# Patient Record
Sex: Female | Born: 1990
Health system: Southern US, Community
[De-identification: ages and names within clinical notes are randomized; demographics above are authoritative.]

## PROBLEM LIST (undated history)

## (undated) ENCOUNTER — Ambulatory Visit: Admission: EM | Payer: 59 | Source: Home / Self Care

## (undated) DIAGNOSIS — D649 Anemia, unspecified: Secondary | ICD-10-CM

## (undated) DIAGNOSIS — F411 Generalized anxiety disorder: Secondary | ICD-10-CM

## (undated) DIAGNOSIS — T7840XA Allergy, unspecified, initial encounter: Secondary | ICD-10-CM

## (undated) DIAGNOSIS — N809 Endometriosis, unspecified: Secondary | ICD-10-CM

## (undated) DIAGNOSIS — D509 Iron deficiency anemia, unspecified: Secondary | ICD-10-CM

## (undated) HISTORY — PX: OTHER SURGICAL HISTORY: SHX169

## (undated) HISTORY — DX: Allergy, unspecified, initial encounter: T78.40XA

## (undated) HISTORY — DX: Anemia, unspecified: D64.9

## (undated) HISTORY — DX: Iron deficiency anemia, unspecified: D50.9

---
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
## 2012-02-06 ENCOUNTER — Encounter (HOSPITAL_COMMUNITY): Payer: BC Managed Care – PPO | Attending: Oncology

## 2012-02-06 DIAGNOSIS — D509 Iron deficiency anemia, unspecified: Secondary | ICD-10-CM | POA: Insufficient documentation

## 2012-02-06 LAB — CBC
HCT: 34.5 % — ABNORMAL LOW (ref 36.0–46.0)
Hemoglobin: 10.5 g/dL — ABNORMAL LOW (ref 12.0–15.0)
MCH: 21.7 pg — ABNORMAL LOW (ref 26.0–34.0)
MCHC: 30.4 g/dL (ref 30.0–36.0)
MCV: 71.3 fL — ABNORMAL LOW (ref 78.0–100.0)
RDW: 21.5 % — ABNORMAL HIGH (ref 11.5–15.5)

## 2012-02-06 LAB — DIFFERENTIAL
Basophils Absolute: 0.1 10*3/uL (ref 0.0–0.1)
Basophils Relative: 1 % (ref 0–1)
Eosinophils Relative: 2 % (ref 0–5)
Monocytes Absolute: 0.4 10*3/uL (ref 0.1–1.0)
Monocytes Relative: 5 % (ref 3–12)

## 2012-02-06 LAB — FERRITIN: Ferritin: 12 ng/mL (ref 10–291)

## 2012-02-06 LAB — IRON AND TIBC
Iron: 53 ug/dL (ref 42–135)
Saturation Ratios: 11 % — ABNORMAL LOW (ref 20–55)
TIBC: 461 ug/dL (ref 250–470)
UIBC: 408 ug/dL — ABNORMAL HIGH (ref 125–400)

## 2012-02-08 ENCOUNTER — Ambulatory Visit (HOSPITAL_COMMUNITY): Payer: Self-pay | Admitting: Oncology

## 2012-02-10 ENCOUNTER — Encounter (HOSPITAL_BASED_OUTPATIENT_CLINIC_OR_DEPARTMENT_OTHER): Payer: BC Managed Care – PPO | Admitting: Oncology

## 2012-02-10 VITALS — BP 103/66 | HR 73 | Temp 98.3°F | Wt 108.8 lb

## 2012-02-10 DIAGNOSIS — D649 Anemia, unspecified: Secondary | ICD-10-CM

## 2012-02-10 DIAGNOSIS — D509 Iron deficiency anemia, unspecified: Secondary | ICD-10-CM

## 2012-02-10 NOTE — Patient Instructions (Signed)
[place] J Lafave  [id number] 06/14/1991   [facility] Specialty Clinic  Discharge Instructions  RECOMMENDATIONS MADE BY THE CONSULTANT AND ANY TEST RESULTS WILL BE SENT TO YOUR REFERRING DOCTOR.   EXAM FINDINGS BY MD TODAY AND SIGNS AND SYMPTOMS TO REPORT TO CLINIC OR PRIMARY MD: need to make sure you are taking 2 iron pills daily.  MEDICATIONS PRESCRIBED: none   INSTRUCTIONS GIVEN AND DISCUSSED: Other :  Report increased ice intake, increased fatigue, etc.  SPECIAL INSTRUCTIONS/FOLLOW-UP: Lab work Needed in 3 months and Return to Clinic in 3 months to see PA.   I acknowledge that I have been informed and understand all the instructions given to me and received a copy. I do not have any more questions at this time, but understand that I may call the Specialty Clinic at [facility] at [phone] during business hours should I have any further questions or need assistance in obtaining follow-up care.    __________________________________________  _____________  __________ Signature of Patient or Authorized Representative            Date                   Time    __________________________________________ Nurse's Signature

## 2012-02-10 NOTE — Progress Notes (Signed)
Geraldo Pitter, MD, MD [address] [place] [address]  1. Iron deficiency anemia  ferrous fumarate (HEMOCYTE - 106 MG FE) 325 (106 FE) MG TABS, CBC, Differential, Iron and TIBC, Ferritin    CURRENT THERAPY: Ferrous Sulfate 325 mg PO  INTERVAL HISTORY: [place] J Wages 21 y.o. female returns for  regular  visit for followup of iron deficiency anemia.  The patient is taking ferrous sulfate 325 mg by mouth. She was told to start taking 2 pills daily. She admits she has not been following these instructions. As that she's been taking 1 pill a day. She admits that she is tolerating the pills well. She denies any nausea, vomiting, or constipation. She explains that "I forget to take the medication twice a day."  I personally reviewed and went over laboratory results with the patient.  Her ferritin has increased nicely from 2-12. Her hemoglobin has also increased from 9.3 g/dL to 40.9 g/dL.  Her TIBC remains elevated.  Her fecal stool cards were negative for blood. She denies any menorrhagia.   As usual, the patient is not very talkative. We again had another educational session regarding iron deficiency anemia. She clearly is absorbing iron well by mouth which is evident by her ferritin.   ROS: No TIA's or unusual headaches, no dysphagia.  No prolonged cough. No dyspnea or chest pain on exertion.  No abdominal pain, change in bowel habits, black or bloody stools.  No urinary tract symptoms.  No new or unusual musculoskeletal symptoms.  Normal menses, no abnormal vaginal bleeding, discharge or unexpected pelvic pain. No new breast lumps, breast pain or nipple discharge.  The patient reports that her college education is going well. She is wished her major from nursing to respiratory therapy. 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 I've encouraged the patient to investigate whether she can job shadow one of our respiratory  therapist here at [facility]. She reports she will research that idea.   Past Medical History  Diagnosis Date  . Anemia   . Iron deficiency anemia 12/19/2011    has Iron deficiency anemia on her problem list.     is allergic to penicillins.  Ms. Mohrmann does not currently have medications on file.  No past surgical history on file.  Denies any headaches, dizziness, double vision, fevers, chills, night sweats, nausea, vomiting, diarrhea, constipation, chest pain, heart palpitations, shortness of breath, blood in stool, black tarry stool, urinary pain, urinary burning, urinary frequency, hematuria.   PHYSICAL EXAMINATION  ECOG PERFORMANCE STATUS: 0 - Asymptomatic  Filed Vitals:   02/10/12 1436  BP: 103/66  Pulse: 73  Temp: 98.3 F (36.8 C)    GENERAL:alert, healthy, no distress, well nourished, well developed, comfortable, cooperative and smiling SKIN: skin color, texture, turgor are normal, no rashes or significant lesions HEAD: Normocephalic, No masses, lesions, tenderness or abnormalities EYES: normal, EOMI, Conjunctiva are pink and non-injected EARS: External ears normal OROPHARYNX:lips, buccal mucosa, and tongue normal and mucous membranes are moist  NECK: supple, no adenopathy, thyroid normal size, non-tender, without nodularity, no stridor, non-tender, trachea midline LYMPH:  no palpable lymphadenopathy BREAST:not examined LUNGS: clear to auscultation and percussion HEART: regular rate & rhythm, no murmurs, no gallops, S1 normal and S2 normal ABDOMEN:abdomen soft, non-tender and normal bowel sounds BACK: Back symmetric, no curvature. EXTREMITIES:less then 2 second capillary refill, no joint deformities, effusion, or inflammation, no edema, no  skin discoloration, no clubbing, no cyanosis  NEURO: alert & oriented x 3 with fluent speech, no focal motor/sensory deficits, gait normal   LABORATORY DATA: Results for Wormley, [place] J (MRN [id number]) as of 02/10/2012 15:11   Ref. Range 12/19/2011 17:00 12/26/2011 14:21 12/26/2011 14:22 02/06/2012 14:14  WBC Latest Range: 4.0-10.5 K/uL 6.2   8.0  RBC Latest Range: 3.87-5.11 MIL/uL 4.47   4.84  HGB Latest Range: 12.0-15.0 g/dL 9.3 (L)   56.2 (L)  HCT Latest Range: 36.0-46.0 % 30.7 (L)   34.5 (L)  MCV Latest Range: 78.0-100.0 fL 68.7 (L)   71.3 (L)  MCH Latest Range: 26.0-34.0 pg 20.8 (L)   21.7 (L)  MCHC Latest Range: 30.0-36.0 g/dL 13.0   86.5  RDW Latest Range: 11.5-15.5 % 16.1 (H)   21.5 (H)  Platelets Latest Range: 150-400 K/uL 318   PLATELET CLUMPS NOTED ON SMEAR, COUNT APPEARS ADEQUATE    Results for Yuan, [place] J (MRN [id number]) as of 02/10/2012 15:11  Ref. Range 12/19/2011 17:00 12/26/2011 14:21 12/26/2011 14:22 02/06/2012 14:14  Iron Latest Range: 42-135 ug/dL 14 (L)   53  UIBC Latest Range: 125-400 ug/dL 284 (H)   132 (H)  TIBC Latest Range: 250-470 ug/dL 440 (H)   102  Saturation Ratios Latest Range: 20-55 % 2 (L)   11 (L)  Ferritin Latest Range: 10-291 ng/mL 4 (L)   12    Results for Swickard, [place] J (MRN [id number]) as of 02/10/2012 15:11  Ref. Range 12/26/2011 14:21 12/26/2011 14:22  Fecal Occult Blood, POC No range found NEGATIVE NEGATIVE       ASSESSMENT:  1. Anemia, probable iron deficiency anemia   PLAN:  1. I encourage the patient to increase her ferrous sulfate to twice a day dosing. 2. Laboratory work in 3 months time: CBC, ferritin, iron/TIBC 3. I personally reviewed and went over laboratory results with the patient. 4. Patient education regarding iron deficiency anemia. 5. Will continue with twice a day dosing of ferrous sulfate until hemoglobin is within normal limits. Then we will decrease dose to ferrous sulfate 325 mg, by mouth daily. 6. Return in 3 months time for followup.   All questions were answered. The patient knows to call the clinic with any problems, questions or concerns. We can certainly see the patient much sooner if necessary.  The patient and plan discussed with Glenford Peers, MD and he is in agreement with the aforementioned.  Jeffry Vogelsang

## 2012-02-27 NOTE — Progress Notes (Signed)
Labs drawn

## 2012-05-11 ENCOUNTER — Encounter (HOSPITAL_COMMUNITY): Payer: 59 | Attending: Oncology

## 2012-05-11 DIAGNOSIS — D509 Iron deficiency anemia, unspecified: Secondary | ICD-10-CM

## 2012-05-11 LAB — CBC
MCHC: 30.9 g/dL (ref 30.0–36.0)
RDW: 15.1 % (ref 11.5–15.5)

## 2012-05-11 LAB — DIFFERENTIAL
Basophils Absolute: 0 10*3/uL (ref 0.0–0.1)
Basophils Relative: 1 % (ref 0–1)
Monocytes Absolute: 0.5 10*3/uL (ref 0.1–1.0)
Neutro Abs: 3.3 10*3/uL (ref 1.7–7.7)
Neutrophils Relative %: 54 % (ref 43–77)

## 2012-05-11 LAB — IRON AND TIBC
Saturation Ratios: 44 % (ref 20–55)
TIBC: 403 ug/dL (ref 250–470)

## 2012-05-11 NOTE — Progress Notes (Signed)
Lab draw

## 2012-05-16 ENCOUNTER — Encounter (HOSPITAL_COMMUNITY): Payer: Self-pay | Admitting: Oncology

## 2012-05-16 ENCOUNTER — Encounter (HOSPITAL_COMMUNITY): Payer: 59 | Admitting: Oncology

## 2015-05-15 ENCOUNTER — Ambulatory Visit (INDEPENDENT_AMBULATORY_CARE_PROVIDER_SITE_OTHER): Payer: 59 | Admitting: Family Medicine

## 2015-05-15 VITALS — BP 118/68 | HR 76 | Temp 98.6°F | Resp 18 | Ht 64.75 in | Wt 110.6 lb

## 2015-05-15 DIAGNOSIS — Z111 Encounter for screening for respiratory tuberculosis: Secondary | ICD-10-CM

## 2015-05-15 DIAGNOSIS — Z029 Encounter for administrative examinations, unspecified: Secondary | ICD-10-CM | POA: Diagnosis not present

## 2015-05-15 DIAGNOSIS — D509 Iron deficiency anemia, unspecified: Secondary | ICD-10-CM | POA: Diagnosis not present

## 2015-05-15 LAB — POCT URINALYSIS DIPSTICK
Bilirubin, UA: NEGATIVE
Blood, UA: NEGATIVE
GLUCOSE UA: NEGATIVE
Ketones, UA: NEGATIVE
Leukocytes, UA: NEGATIVE
NITRITE UA: NEGATIVE
Protein, UA: 30
SPEC GRAV UA: 1.025
UROBILINOGEN UA: 0.2
pH, UA: 6.5
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
## 2015-05-15 NOTE — Progress Notes (Signed)

## 2015-05-16 LAB — CBC WITH DIFFERENTIAL/PLATELET
BASOS ABS: 0.1 10*3/uL (ref 0.0–0.1)
BASOS PCT: 1 % (ref 0–1)
Eosinophils Absolute: 0.2 10*3/uL (ref 0.0–0.7)
Eosinophils Relative: 3 % (ref 0–5)
HEMATOCRIT: 32.4 % — AB (ref 36.0–46.0)
Hemoglobin: 10.3 g/dL — ABNORMAL LOW (ref 12.0–15.0)
Lymphocytes Relative: 23 % (ref 12–46)
Lymphs Abs: 1.7 10*3/uL (ref 0.7–4.0)
MCH: 24.6 pg — ABNORMAL LOW (ref 26.0–34.0)
MCHC: 31.8 g/dL (ref 30.0–36.0)
MCV: 77.5 fL — AB (ref 78.0–100.0)
MONO ABS: 0.8 10*3/uL (ref 0.1–1.0)
MPV: 11.3 fL (ref 8.6–12.4)
Monocytes Relative: 10 % (ref 3–12)
Neutro Abs: 4.7 10*3/uL (ref 1.7–7.7)
Neutrophils Relative %: 63 % (ref 43–77)
PLATELETS: 301 10*3/uL (ref 150–400)
RBC: 4.18 MIL/uL (ref 3.87–5.11)
RDW: 13.9 % (ref 11.5–15.5)
WBC: 7.5 10*3/uL (ref 4.0–10.5)

## 2015-05-18 ENCOUNTER — Ambulatory Visit (INDEPENDENT_AMBULATORY_CARE_PROVIDER_SITE_OTHER): Payer: 59

## 2015-05-18 DIAGNOSIS — Z111 Encounter for screening for respiratory tuberculosis: Secondary | ICD-10-CM

## 2015-05-18 LAB — TB SKIN TEST
INDURATION: 0 mm
TB Skin Test: NEGATIVE

## 2015-07-17 ENCOUNTER — Encounter: Payer: Self-pay | Admitting: Family Medicine

## 2015-07-17 ENCOUNTER — Ambulatory Visit (INDEPENDENT_AMBULATORY_CARE_PROVIDER_SITE_OTHER): Payer: 59 | Admitting: Family Medicine

## 2015-07-17 VITALS — BP 100/60 | HR 70 | Temp 98.4°F | Resp 16 | Ht 64.5 in | Wt 105.0 lb

## 2015-07-17 DIAGNOSIS — J029 Acute pharyngitis, unspecified: Secondary | ICD-10-CM | POA: Diagnosis not present

## 2015-07-17 LAB — POCT RAPID STREP A (OFFICE): Rapid Strep A Screen: NEGATIVE

## 2015-07-17 NOTE — Patient Instructions (Signed)
Drink plenty of fluids and get enough rest  Take Tylenol ibuprofen for pain  Use lozenges as needed  Pharyngitis Pharyngitis is redness, pain, and swelling (inflammation) of your pharynx.  CAUSES  Pharyngitis is usually caused by infection. Most of the time, these infections are from viruses (viral) and are part of a cold. However, sometimes pharyngitis is caused by bacteria (bacterial). Pharyngitis can also be caused by allergies. Viral pharyngitis may be spread from person to person by coughing, sneezing, and personal items or utensils (cups, forks, spoons, toothbrushes). Bacterial pharyngitis may be spread from person to person by more intimate contact, such as kissing.  SIGNS AND SYMPTOMS  Symptoms of pharyngitis include:   Sore throat.   Tiredness (fatigue).   Low-grade fever.   Headache.  Joint pain and muscle aches.  Skin rashes.  Swollen lymph nodes.  Plaque-like film on throat or tonsils (often seen with bacterial pharyngitis). DIAGNOSIS  Your health care provider will ask you questions about your illness and your symptoms. Your medical history, along with a physical exam, is often all that is needed to diagnose pharyngitis. Sometimes, a rapid strep test is done. Other lab tests may also be done, depending on the suspected cause.  TREATMENT  Viral pharyngitis will usually get better in 3-4 days without the use of medicine. Bacterial pharyngitis is treated with medicines that kill germs (antibiotics).  HOME CARE INSTRUCTIONS   Drink enough water and fluids to keep your urine clear or pale yellow.   Only take over-the-counter or prescription medicines as directed by your health care provider:   If you are prescribed antibiotics, make sure you finish them even if you start to feel better.   Do not take aspirin.   Get lots of rest.   Gargle with 8 oz of salt water ( tsp of salt per 1 qt of water) as often as every 1-2 hours to soothe your throat.   Throat  lozenges (if you are not at risk for choking) or sprays may be used to soothe your throat. SEEK MEDICAL CARE IF:   You have large, tender lumps in your neck.  You have a rash.  You cough up green, yellow-brown, or bloody spit. SEEK IMMEDIATE MEDICAL CARE IF:   Your neck becomes stiff.  You drool or are unable to swallow liquids.  You vomit or are unable to keep medicines or liquids down.  You have severe pain that does not go away with the use of recommended medicines.  You have trouble breathing (not caused by a stuffy nose). MAKE SURE YOU:   Understand these instructions.  Will watch your condition.  Will get help right away if you are not doing well or get worse. Document Released: 10/24/2005 Document Revised: 08/14/2013 Document Reviewed: 07/01/2013 [facility] Patient Information 2015 [place], [place]. This information is not intended to replace advice given to you by your health care provider. Make sure you discuss any questions you have with your health care provider.

## 2015-07-17 NOTE — Progress Notes (Signed)
Subjective:  Patient ID: Niger J Goldammer, female    DOB: 09/29/1991  Age: 24 y.o. MRN: [id number]  Sore throat since yesterday. No fever. No other major symptoms. Does smoke about 3 cigarettes a day.   Objective:   No acute distress. TMs normal. Neck supple with small nodes. Chest clear. Heart regular.  Results for orders placed or performed in visit on 07/17/15  POCT rapid strep A  Result Value Ref Range   Rapid Strep A Screen Negative Negative     Assessment & Plan:   Assessment:  Viral pharyngitis Results for orders placed or performed in visit on 07/17/15  POCT rapid strep A  Result Value Ref Range   Rapid Strep A Screen Negative Negative     Plan:  Reassurance Results for orders placed or performed in visit on 07/17/15  POCT rapid strep A  Result Value Ref Range   Rapid Strep A Screen Negative Negative     Patient Instructions  Drink plenty of fluids and get enough rest  Take Tylenol ibuprofen for pain  Use lozenges as needed  Pharyngitis Pharyngitis is redness, pain, and swelling (inflammation) of your pharynx.  CAUSES  Pharyngitis is usually caused by infection. Most of the time, these infections are from viruses (viral) and are part of a cold. However, sometimes pharyngitis is caused by bacteria (bacterial). Pharyngitis can also be caused by allergies. Viral pharyngitis may be spread from person to person by coughing, sneezing, and personal items or utensils (cups, forks, spoons, toothbrushes). Bacterial pharyngitis may be spread from person to person by more intimate contact, such as kissing.  SIGNS AND SYMPTOMS  Symptoms of pharyngitis include:   Sore throat.   Tiredness (fatigue).   Low-grade fever.   Headache.  Joint pain and muscle aches.  Skin rashes.  Swollen lymph nodes.  Plaque-like film on throat or tonsils (often seen with bacterial pharyngitis). DIAGNOSIS  Your health care provider will ask you questions about your illness  and your symptoms. Your medical history, along with a physical exam, is often all that is needed to diagnose pharyngitis. Sometimes, a rapid strep test is done. Other lab tests may also be done, depending on the suspected cause.  TREATMENT  Viral pharyngitis will usually get better in 3-4 days without the use of medicine. Bacterial pharyngitis is treated with medicines that kill germs (antibiotics).  HOME CARE INSTRUCTIONS   Drink enough water and fluids to keep your urine clear or pale yellow.   Only take over-the-counter or prescription medicines as directed by your health care provider:   If you are prescribed antibiotics, make sure you finish them even if you start to feel better.   Do not take aspirin.   Get lots of rest.   Gargle with 8 oz of salt water ( tsp of salt per 1 qt of water) as often as every 1-2 hours to soothe your throat.   Throat lozenges (if you are not at risk for choking) or sprays may be used to soothe your throat. SEEK MEDICAL CARE IF:   You have large, tender lumps in your neck.  You have a rash.  You cough up green, yellow-brown, or bloody spit. SEEK IMMEDIATE MEDICAL CARE IF:   Your neck becomes stiff.  You drool or are unable to swallow liquids.  You vomit or are unable to keep medicines or liquids down.  You have severe pain that does not go away with the use of recommended medicines.  You have trouble breathing (  not caused by a stuffy nose). MAKE SURE YOU:   Understand these instructions.  Will watch your condition.  Will get help right away if you are not doing well or get worse. Document Released: 10/24/2005 Document Revised: 08/14/2013 Document Reviewed: 07/01/2013 [facility] Patient Information 2015 [place], [place]. This information is not intended to replace advice given to you by your health care provider. Make sure you discuss any questions you have with your health care provider.      HOPPER,DAVID, MD 07/17/2015

## 2015-07-19 LAB — CULTURE, GROUP A STREP: ORGANISM ID, BACTERIA: NORMAL

## 2015-08-15 ENCOUNTER — Ambulatory Visit (INDEPENDENT_AMBULATORY_CARE_PROVIDER_SITE_OTHER): Payer: 59 | Admitting: Physician Assistant

## 2015-08-15 VITALS — BP 110/64 | HR 83 | Temp 98.2°F | Resp 16 | Ht 65.25 in | Wt 108.8 lb

## 2015-08-15 DIAGNOSIS — J029 Acute pharyngitis, unspecified: Secondary | ICD-10-CM | POA: Diagnosis not present

## 2015-08-15 DIAGNOSIS — J069 Acute upper respiratory infection, unspecified: Secondary | ICD-10-CM

## 2015-08-15 DIAGNOSIS — B9789 Other viral agents as the cause of diseases classified elsewhere: Secondary | ICD-10-CM

## 2015-08-15 LAB — POCT RAPID STREP A (OFFICE): Rapid Strep A Screen: NEGATIVE

## 2015-08-15 MED ORDER — BENZONATATE 100 MG PO CAPS
100.0000 mg | ORAL_CAPSULE | Freq: Three times a day (TID) | ORAL | Status: DC | PRN
Start: 2015-08-15 — End: 2015-08-24

## 2015-08-15 NOTE — Progress Notes (Signed)
[facility] [address] [place] [address] [phone]  Date:  08/15/2015   Name:  Teresa Mclaughlin   DOB:  06/29/1991   MRN:  [id number]  PCP:  Teresa Peers, MD    Chief Complaint: Cough and Fatigue   History of Present Illness:  This is a 24 y.o. female with PMH iron def anemia who is presenting with cough and fatigue x 2 days. She started with sore throat 5 days ago but this resolved. 2 days ago she started with a dry cough - today woke up and cough is productive. She also started with nasal congestion this morning. Her throat only hurts now if she coughs. She feels fatigued and occ feels hot. No fever or chills. She is sleeping ok. She denies sob, wheezing, otalgia. She has tried ibuprofen - last took 24 hours ago. She denies hx env allergies or asthma. She is not a smoker.  Review of Systems:  Review of Systems See HPI  Patient Active Problem List   Diagnosis Date Noted  . Iron deficiency anemia 12/19/2011    Prior to Admission medications   Medication Sig Start Date End Date Taking? Authorizing Provider  meloxicam (MOBIC) 7.5 MG tablet Take 7.5 mg by mouth daily.    Historical Provider, MD    Allergies  Allergen Reactions  . Penicillins     unknown    History reviewed. No pertinent past surgical history.  Social History  Substance Use Topics  . Smoking status: Former Research scientist (life sciences)  . Smokeless tobacco: Never Used  . Alcohol Use: No    Family History  Problem Relation Age of Onset  . Diabetes Mother   . Hypertension Mother   . Diabetes Paternal Grandfather     Medication list has been reviewed and updated.  Physical Examination:  Physical Exam  Constitutional: She is oriented to person, place, and time. She appears well-developed and well-nourished. No distress.  HENT:  Head: Normocephalic and atraumatic.  Right Ear: Hearing, tympanic membrane, external ear and ear canal normal.  Left Ear: Hearing, tympanic membrane, external ear  and ear canal normal.  Nose: Nose normal.  Mouth/Throat: Uvula is midline and mucous membranes are normal. Posterior oropharyngeal edema and posterior oropharyngeal erythema present. No oropharyngeal exudate.  Eyes: Conjunctivae and lids are normal. Right eye exhibits no discharge. Left eye exhibits no discharge. No scleral icterus.  Cardiovascular: Normal rate, regular rhythm, normal heart sounds and normal pulses.   No murmur heard. Pulmonary/Chest: Effort normal and breath sounds normal. No respiratory distress. She has no wheezes. She has no rhonchi. She has no rales.  Musculoskeletal: Normal range of motion.  Lymphadenopathy:       Head (right side): No submental, no submandibular and no tonsillar adenopathy present.       Head (left side): No submental, no submandibular and no tonsillar adenopathy present.    She has cervical adenopathy (right, anterior).  Neurological: She is alert and oriented to person, place, and time.  Skin: Skin is warm, dry and intact. No lesion and no rash noted.  Psychiatric: She has a normal mood and affect. Her speech is normal and behavior is normal. Thought content normal.   BP 110/64 mmHg  Pulse 83  Temp(Src) 98.2 F (36.8 C) (Oral)  Resp 16  Ht 5' 5.25" (1.657 m)  Wt 108 lb 12.8 oz (49.351 kg)  BMI 17.97 kg/m2  SpO2 96%  LMP 08/10/2015  Results for orders placed or performed in visit  on 08/15/15  POCT rapid strep A  Result Value Ref Range   Rapid Strep A Screen Negative Negative    Assessment and Plan:  1. Sore throat 2. Viral uri with cough Rapid strep negative, culture pending. Etiology likely viral, focus is on supportive care. Pt will let me know if her sx are not improving in 5 days and I would consider prescribing an abx. - POCT rapid strep A - Culture, Group A Strep - benzonatate (TESSALON) 100 MG capsule; Take 1-2 capsules (100-200 mg total) by mouth 3 (three) times daily as needed for cough.  Dispense: 40 capsule; Refill:  0   Benjaman Pott. Drenda Freeze, MHS [facility]  08/15/2015

## 2015-08-15 NOTE — Patient Instructions (Signed)
Take ibuprofen 600-800 mg three times a day to help your throat pain. Drink plenty of water (64 oz/day) and get plenty of rest. Take tessalon for cough during the day. Let me know if your symptoms are not improving in 5 days.

## 2015-08-17 LAB — CULTURE, GROUP A STREP: Organism ID, Bacteria: NORMAL

## 2015-08-24 ENCOUNTER — Ambulatory Visit: Payer: 59 | Admitting: Physician Assistant

## 2015-08-24 NOTE — Progress Notes (Signed)
Opened in error

## 2015-08-26 NOTE — Progress Notes (Signed)
  Medical screening examination/treatment/procedure(s) were performed by non-physician practitioner and as supervising physician I was immediately available for consultation/collaboration.     

## 2016-01-21 DIAGNOSIS — F4321 Adjustment disorder with depressed mood: Secondary | ICD-10-CM | POA: Diagnosis not present

## 2016-02-04 DIAGNOSIS — F4321 Adjustment disorder with depressed mood: Secondary | ICD-10-CM | POA: Diagnosis not present

## 2016-02-15 DIAGNOSIS — F4321 Adjustment disorder with depressed mood: Secondary | ICD-10-CM | POA: Diagnosis not present

## 2016-03-01 DIAGNOSIS — F4321 Adjustment disorder with depressed mood: Secondary | ICD-10-CM | POA: Diagnosis not present

## 2016-10-08 ENCOUNTER — Encounter: Payer: Self-pay | Admitting: Physician Assistant

## 2016-10-08 ENCOUNTER — Ambulatory Visit (INDEPENDENT_AMBULATORY_CARE_PROVIDER_SITE_OTHER): Payer: 59 | Admitting: Physician Assistant

## 2016-10-08 VITALS — BP 106/62 | HR 85 | Temp 98.2°F | Resp 16 | Ht 62.25 in | Wt 106.0 lb

## 2016-10-08 DIAGNOSIS — F411 Generalized anxiety disorder: Secondary | ICD-10-CM | POA: Diagnosis not present

## 2016-10-08 MED ORDER — ESCITALOPRAM OXALATE 10 MG PO TABS: 10.0000 mg | ORAL_TABLET | Freq: Every day | ORAL | 1 refills | Status: DC

## 2016-10-08 NOTE — Patient Instructions (Signed)
     IF you received an x-ray today, you will receive an invoice from [facility]. Please contact [facility] at [phone] with questions or concerns regarding your invoice.   IF you received labwork today, you will receive an invoice from [facility]. Please contact [facility] at [phone] with questions or concerns regarding your invoice.   Our billing staff will not be able to assist you with questions regarding bills from these companies.  You will be contacted with the lab results as soon as they are available. The fastest way to get your results is to activate your My Chart account. Instructions are located on the last page of this paperwork. If you have not heard from us regarding the results in 2 weeks, please contact this office.      

## 2016-10-08 NOTE — Progress Notes (Signed)
   Teresa Mclaughlin  MRN: [id number] DOB: 12-25-1990  Subjective:  Pt presents to clinic with increased anxiety - she has had problems with anxiety for a while but recently is is getting worse  She tried therapy last spring but she did not feel like it helped and she really does not like talking to people.  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  She has trouble sleeping 1-2x/week because she cannot stop thinking.  She is not having physical symptoms of anxiety.  She does not have panic attacks.  She has never been treated before.  Her father has PTSD but he does not take his medication.  She also would like to gain weight - she has no appetite and knows that when she is stressed she does not have an appetite and she has trouble eating when she is not hungry.  She has problems with depression but she does not think that bothers her as much as the anxiety and the constant worry that she has.  She worries about everything - things that affect her that she has control over, things that she does not have control over and things that do not directly affect her.  She is not always sure why she worries about certain things.  Review of Systems  Psychiatric/Behavioral: Positive for dysphoric mood and sleep disturbance. The patient is nervous/anxious.     Patient Active Problem List   Diagnosis Date Noted  . Iron deficiency anemia 12/19/2011    No current outpatient prescriptions on file prior to visit.   No current facility-administered medications on file prior to visit.     Allergies  Allergen Reactions  . Penicillins     unknown    Pt patients past, family and social history were reviewed and updated.   Objective:  BP 106/62   Pulse 85   Temp 98.2 F (36.8 C) (Oral)   Resp 16   Ht 5' 2.25" (1.581 m)   Wt 106 lb (48.1 kg)   SpO2 100%   BMI 19.23 kg/m   Physical  Exam  Constitutional: She is oriented to person, place, and time and well-developed, well-nourished, and in no distress.  HENT:  Head: Normocephalic and atraumatic.  Right Ear: Hearing and external ear normal.  Left Ear: Hearing and external ear normal.  Eyes: Conjunctivae are normal.  Neck: Normal range of motion.  Pulmonary/Chest: Effort normal.  Neurological: She is alert and oriented to person, place, and time. Gait normal.  Skin: Skin is warm and dry.  Psychiatric: Memory, affect and judgment normal. She exhibits a depressed mood.  Vitals reviewed.   Assessment and Plan :  GAD (generalized anxiety disorder) - Plan: escitalopram (LEXAPRO) 10 MG tablet pt also has components of depression - we will start medication and we discussed what to expect and when to expect it - she will recheck with me in about 4 weeks and at that time we will determine the next step - she is not interested in therapy at this time.  We may consider Paxil for weight gain but it has a higher side effect potential so we will try Lexapro and hope that with her decrease anxiety she will have an increase in appetite.    Windell Hummingbird PA-C  [facility] 10/08/2016 5:00 PM

## 2016-10-19 ENCOUNTER — Encounter: Payer: Self-pay | Admitting: Physician Assistant

## 2016-10-21 MED ORDER — HYDROXYZINE HCL 25 MG PO TABS: 12.5000 mg | ORAL_TABLET | Freq: Three times a day (TID) | ORAL | 0 refills | Status: DC | PRN

## 2016-10-21 NOTE — Telephone Encounter (Signed)
Pt did not respond well to Lexapro - will do a trial of Atarax

## 2016-11-11 ENCOUNTER — Other Ambulatory Visit: Payer: Self-pay | Admitting: Physician Assistant

## 2016-11-11 NOTE — Telephone Encounter (Signed)
Mychart message for refill of Atarax. To Judson Roch.

## 2016-11-15 ENCOUNTER — Other Ambulatory Visit: Payer: Self-pay | Admitting: *Deleted

## 2016-11-15 DIAGNOSIS — F419 Anxiety disorder, unspecified: Secondary | ICD-10-CM

## 2016-11-15 MED ORDER — HYDROXYZINE HCL 25 MG PO TABS: 12.5000 mg | ORAL_TABLET | Freq: Three times a day (TID) | ORAL | 0 refills | Status: DC | PRN

## 2016-11-17 MED ORDER — HYDROXYZINE HCL 25 MG PO TABS: 12.5000 mg | ORAL_TABLET | Freq: Three times a day (TID) | ORAL | 0 refills | Status: DC | PRN

## 2016-11-17 NOTE — Telephone Encounter (Signed)
Done

## 2016-12-12 ENCOUNTER — Other Ambulatory Visit: Payer: Self-pay | Admitting: Physician Assistant

## 2017-01-26 ENCOUNTER — Other Ambulatory Visit: Payer: Self-pay | Admitting: Physician Assistant

## 2017-02-17 ENCOUNTER — Other Ambulatory Visit: Payer: Self-pay | Admitting: Physician Assistant

## 2017-02-19 NOTE — Telephone Encounter (Signed)
01/28/17 last refill

## 2017-02-26 ENCOUNTER — Other Ambulatory Visit: Payer: Self-pay | Admitting: Physician Assistant

## 2017-03-27 ENCOUNTER — Encounter: Payer: Self-pay | Admitting: Emergency Medicine

## 2017-03-27 ENCOUNTER — Ambulatory Visit (INDEPENDENT_AMBULATORY_CARE_PROVIDER_SITE_OTHER): Payer: 59 | Admitting: Emergency Medicine

## 2017-03-27 VITALS — BP 113/81 | HR 86 | Temp 98.4°F | Resp 16 | Ht 64.5 in

## 2017-03-27 DIAGNOSIS — Z111 Encounter for screening for respiratory tuberculosis: Secondary | ICD-10-CM

## 2017-03-27 NOTE — Progress Notes (Signed)

## 2017-03-27 NOTE — Patient Instructions (Signed)
     IF you received an x-ray today, you will receive an invoice from [facility]. Please contact [facility] at [phone] with questions or concerns regarding your invoice.   IF you received labwork today, you will receive an invoice from [facility]. Please contact [facility] at [phone] with questions or concerns regarding your invoice.   Our billing staff will not be able to assist you with questions regarding bills from these companies.  You will be contacted with the lab results as soon as they are available. The fastest way to get your results is to activate your My Chart account. Instructions are located on the last page of this paperwork. If you have not heard from us regarding the results in 2 weeks, please contact this office.     

## 2017-03-29 ENCOUNTER — Other Ambulatory Visit (INDEPENDENT_AMBULATORY_CARE_PROVIDER_SITE_OTHER): Payer: 59 | Admitting: Physician Assistant

## 2017-03-29 DIAGNOSIS — Z111 Encounter for screening for respiratory tuberculosis: Secondary | ICD-10-CM

## 2017-03-29 LAB — TB SKIN TEST: TB Skin Test: NEGATIVE

## 2017-03-29 NOTE — Progress Notes (Signed)
Negative tb read (bruise only)

## 2017-04-21 ENCOUNTER — Other Ambulatory Visit: Payer: Self-pay | Admitting: Physician Assistant

## 2017-05-23 ENCOUNTER — Other Ambulatory Visit: Payer: Self-pay | Admitting: *Deleted

## 2017-05-23 DIAGNOSIS — F411 Generalized anxiety disorder: Secondary | ICD-10-CM

## 2017-05-23 MED ORDER — HYDROXYZINE HCL 25 MG PO TABS: ORAL_TABLET | ORAL | 0 refills | Status: DC

## 2017-05-27 ENCOUNTER — Ambulatory Visit: Payer: 59 | Admitting: Physician Assistant

## 2017-06-19 DIAGNOSIS — Z01419 Encounter for gynecological examination (general) (routine) without abnormal findings: Secondary | ICD-10-CM | POA: Diagnosis not present

## 2017-06-19 DIAGNOSIS — Z1159 Encounter for screening for other viral diseases: Secondary | ICD-10-CM | POA: Diagnosis not present

## 2017-06-29 ENCOUNTER — Other Ambulatory Visit: Payer: Self-pay | Admitting: Physician Assistant

## 2017-07-26 ENCOUNTER — Other Ambulatory Visit: Payer: Self-pay | Admitting: Physician Assistant

## 2017-09-01 ENCOUNTER — Other Ambulatory Visit: Payer: Self-pay | Admitting: Physician Assistant

## 2017-09-27 ENCOUNTER — Other Ambulatory Visit: Payer: Self-pay | Admitting: Physician Assistant

## 2017-09-27 NOTE — Telephone Encounter (Signed)
Please advise on refill. I see 3 of the Hydroxyzine listed.

## 2017-10-27 ENCOUNTER — Other Ambulatory Visit: Payer: Self-pay | Admitting: Physician Assistant

## 2017-12-08 ENCOUNTER — Ambulatory Visit (INDEPENDENT_AMBULATORY_CARE_PROVIDER_SITE_OTHER): Payer: 59 | Admitting: Physician Assistant

## 2017-12-08 ENCOUNTER — Encounter: Payer: Self-pay | Admitting: Physician Assistant

## 2017-12-08 ENCOUNTER — Other Ambulatory Visit: Payer: Self-pay

## 2017-12-08 DIAGNOSIS — F411 Generalized anxiety disorder: Secondary | ICD-10-CM | POA: Diagnosis not present

## 2017-12-08 MED ORDER — HYDROXYZINE HCL 25 MG PO TABS: 25.0000 mg | ORAL_TABLET | Freq: Three times a day (TID) | ORAL | 5 refills | Status: DC

## 2017-12-08 NOTE — Patient Instructions (Signed)
     IF you received an x-ray today, you will receive an invoice from [facility]. Please contact [facility] at [phone] with questions or concerns regarding your invoice.   IF you received labwork today, you will receive an invoice from [facility]. Please contact [facility] at [phone] with questions or concerns regarding your invoice.   Our billing staff will not be able to assist you with questions regarding bills from these companies.  You will be contacted with the lab results as soon as they are available. The fastest way to get your results is to activate your My Chart account. Instructions are located on the last page of this paperwork. If you have not heard from us regarding the results in 2 weeks, please contact this office.     

## 2017-12-08 NOTE — Progress Notes (Signed)
Teresa Mclaughlin  MRN: [id number] DOB: [birth year]  PCP: Lucianne Lei, MD  Chief Complaint  Patient presents with  . Medication Refill    hydroxyine     Subjective:  Pt presents to clinic for medication refills.  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   She is in a big adjustment period of her life.  She takes the atarax about 2-3 times a week - most of the time at night. She does not feel depressed other than sometimes she gets down when she is by herself but she can get around others and that goes away.  She has been interviewing for jobs and that has been stressful.   History is obtained by patient.  Review of Systems  Psychiatric/Behavioral: Positive for sleep disturbance. Negative for decreased concentration, dysphoric mood, self-injury and suicidal ideas. The patient is nervous/anxious.     Patient Active Problem List   Diagnosis Date Noted  . Anxiety state 12/08/2017  . History of iron deficiency anemia 12/19/2011    No current outpatient medications on file prior to visit.   No current facility-administered medications on file prior to visit.     Allergies  Allergen Reactions  . Penicillins     unknown    Past Medical History:  Diagnosis Date  . Allergy   . Anemia   . 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 Smoking status: Former Research scientist (life sciences)  . Smokeless tobacco: Never Used  Substance Use Topics  . Alcohol use: No    Alcohol/week: 0.0 oz  . Drug use: No   family history includes Diabetes  in her mother and paternal grandfather; Hypertension in her mother.     Objective:  BP 108/62   Pulse 80   Temp 98.6 F (37 C) (Oral)   Resp 18   Ht 5' 4.5" (1.638 m)   Wt 116 lb (52.6 kg)   SpO2 100%   BMI 19.60 kg/m  Body mass index is 19.6 kg/m.  Physical Exam  Constitutional: She is oriented to person, place, and time and well-developed, well-nourished, and in no distress.  HENT:  Head: Normocephalic and atraumatic.  Right Ear: Hearing and external ear normal.  Left Ear: Hearing and external ear normal.  Eyes: Conjunctivae are normal.  Neck: Normal range of motion.  Cardiovascular: Normal rate, regular rhythm and normal heart sounds.  No murmur heard. Pulmonary/Chest: Effort normal and breath sounds normal. She has no wheezes.  Neurological: She is alert and oriented to person, place, and time. Gait normal.  Skin: Skin is warm and dry.  Psychiatric: Mood, memory, affect and judgment normal.  Vitals reviewed.   Assessment and Plan :  Anxiety state - Plan: hydrOXYzine (ATARAX/VISTARIL) 25 MG tablet  Pt is doing really well with her anxiety.  She is in a transition state and handling it  well.  She will continue to use as needed - if she finds that her symptoms get worse she will recheck with me and we may need to start an SSRI but currently she does not need anything daily.  Windell Hummingbird PA-C  [facility] 12/08/2017 12:05 PM

## 2018-01-25 DIAGNOSIS — R350 Frequency of micturition: Secondary | ICD-10-CM | POA: Diagnosis not present

## 2018-01-25 DIAGNOSIS — R102 Pelvic and perineal pain: Secondary | ICD-10-CM | POA: Diagnosis not present

## 2018-01-25 DIAGNOSIS — N760 Acute vaginitis: Secondary | ICD-10-CM | POA: Diagnosis not present

## 2018-01-26 ENCOUNTER — Other Ambulatory Visit: Payer: Self-pay | Admitting: Counselor

## 2018-01-26 DIAGNOSIS — R102 Pelvic and perineal pain: Secondary | ICD-10-CM

## 2018-01-28 ENCOUNTER — Emergency Department: Payer: 59

## 2018-01-28 ENCOUNTER — Emergency Department
Admission: EM | Admit: 2018-01-28 | Discharge: 2018-01-28 | Disposition: A | Discharge status: Home / Self Care | Payer: 59 | Attending: Emergency Medicine | Admitting: Emergency Medicine

## 2018-01-28 ENCOUNTER — Other Ambulatory Visit: Payer: Self-pay

## 2018-01-28 ENCOUNTER — Encounter: Payer: Self-pay | Admitting: Emergency Medicine

## 2018-01-28 DIAGNOSIS — N83201 Unspecified ovarian cyst, right side: Secondary | ICD-10-CM | POA: Diagnosis not present

## 2018-01-28 DIAGNOSIS — N83202 Unspecified ovarian cyst, left side: Secondary | ICD-10-CM | POA: Insufficient documentation

## 2018-01-28 DIAGNOSIS — Z87891 Personal history of nicotine dependence: Secondary | ICD-10-CM | POA: Insufficient documentation

## 2018-01-28 DIAGNOSIS — R112 Nausea with vomiting, unspecified: Secondary | ICD-10-CM | POA: Diagnosis not present

## 2018-01-28 DIAGNOSIS — Z79899 Other long term (current) drug therapy: Secondary | ICD-10-CM | POA: Diagnosis not present

## 2018-01-28 DIAGNOSIS — R102 Pelvic and perineal pain: Secondary | ICD-10-CM | POA: Diagnosis present

## 2018-01-28 DIAGNOSIS — R1013 Epigastric pain: Secondary | ICD-10-CM | POA: Diagnosis not present

## 2018-01-28 DIAGNOSIS — R109 Unspecified abdominal pain: Secondary | ICD-10-CM

## 2018-01-28 LAB — COMPREHENSIVE METABOLIC PANEL
ALT: 10 U/L — AB (ref 14–54)
AST: 20 U/L (ref 15–41)
Albumin: 4.5 g/dL (ref 3.5–5.0)
Alkaline Phosphatase: 56 U/L (ref 38–126)
Anion gap: 7 (ref 5–15)
BILIRUBIN TOTAL: 0.5 mg/dL (ref 0.3–1.2)
BUN: 13 mg/dL (ref 6–20)
CHLORIDE: 109 mmol/L (ref 101–111)
CO2: 24 mmol/L (ref 22–32)
CREATININE: 0.7 mg/dL (ref 0.44–1.00)
Calcium: 9.2 mg/dL (ref 8.9–10.3)
Glucose, Bld: 117 mg/dL — ABNORMAL HIGH (ref 65–99)
Potassium: 3.5 mmol/L (ref 3.5–5.1)
Sodium: 140 mmol/L (ref 135–145)
TOTAL PROTEIN: 7.7 g/dL (ref 6.5–8.1)

## 2018-01-28 LAB — CBC
HCT: 35.7 % (ref 35.0–47.0)
Hemoglobin: 12.2 g/dL (ref 12.0–16.0)
MCH: 26.9 pg (ref 26.0–34.0)
MCHC: 34.2 g/dL (ref 32.0–36.0)
MCV: 78.6 fL — ABNORMAL LOW (ref 80.0–100.0)
PLATELETS: 215 10*3/uL (ref 150–440)
RBC: 4.54 MIL/uL (ref 3.80–5.20)
RDW: 14 % (ref 11.5–14.5)
WBC: 10.5 10*3/uL (ref 3.6–11.0)

## 2018-01-28 LAB — URINALYSIS, COMPLETE (UACMP) WITH MICROSCOPIC
BILIRUBIN URINE: NEGATIVE
Bacteria, UA: NONE SEEN
GLUCOSE, UA: NEGATIVE mg/dL
Hgb urine dipstick: NEGATIVE
KETONES UR: 20 mg/dL — AB
NITRITE: NEGATIVE
Protein, ur: NEGATIVE mg/dL
SPECIFIC GRAVITY, URINE: 1.024 (ref 1.005–1.030)
pH: 5 (ref 5.0–8.0)

## 2018-01-28 LAB — LIPASE, BLOOD: LIPASE: 46 U/L (ref 11–51)

## 2018-01-28 LAB — HCG, QUANTITATIVE, PREGNANCY: hCG, Beta Chain, Quant, S: <1 m[IU]/mL (ref <5)

## 2018-01-28 MED ORDER — IBUPROFEN 600 MG PO TABS: 600.0000 mg | ORAL_TABLET | Freq: Three times a day (TID) | ORAL | 0 refills | Status: DC | PRN

## 2018-01-28 MED ORDER — ONDANSETRON 4 MG PO TBDP
4.0000 mg | ORAL_TABLET | Freq: Once | ORAL | Status: AC
  Administered 2018-01-28: 4 mg via ORAL
  Filled 2018-01-28: qty 1

## 2018-01-28 MED ORDER — ONDANSETRON HCL 4 MG PO TABS: 4.0000 mg | ORAL_TABLET | Freq: Three times a day (TID) | ORAL | 0 refills | Status: DC | PRN

## 2018-01-28 MED ORDER — IBUPROFEN 600 MG PO TABS
600.0000 mg | ORAL_TABLET | Freq: Once | ORAL | Status: AC
  Administered 2018-01-28: 600 mg via ORAL
  Filled 2018-01-28: qty 1

## 2018-01-28 NOTE — ED Notes (Signed)
Patient ambulatory to lobby with steady gait, NAD noted. Verbalized understanding of discharge instructions and followup care.

## 2018-01-28 NOTE — ED Notes (Signed)
Returned from XR 

## 2018-01-28 NOTE — ED Provider Notes (Signed)
[facility] Emergency Department Provider Note ____________________________________________   I have reviewed the triage vital signs and the triage nursing note.  HISTORY  Chief Complaint Abdominal Pain   Historian Patient  HPI Teresa Mclaughlin is a 27 y.o. female presents for evaluation of generalized abdominal pain although lower greater than upper and left side more so than right side for about a week now.  States it was gradual onset, somewhat waxing and waning periods unclear whether or not it might be related to food she is decreased her p.o. intake.  She saw an urgent care and was diagnosed with chlamydia and last night took 2 pills of azithromycin and felt nauseated and threw up x1.  This morning or really overnight she also threw up a second time.  This was watery, nonbloody.  Denies constipation or diarrhea.  She also was started on Flagyl and stopped it yesterday after the vomiting and feeling dizziness and feeling like her right leg was tingling at that time which is now gone.  She reports her pain is really sort of over the bladder and left lower quadrant at this point and moderate around 6 out of 10.     Past Medical History:  Diagnosis Date  . Allergy   . Anemia   . Iron deficiency anemia 12/19/2011    Patient Active Problem List   Diagnosis Date Noted  . Anxiety state 12/08/2017  . History of iron deficiency anemia 12/19/2011    History reviewed. No pertinent surgical history.  Prior to Admission medications   Medication Sig Start Date End Date Taking? Authorizing Provider  hydrOXYzine (ATARAX/VISTARIL) 25 MG tablet Take 1 tablet (25 mg total) by mouth 3 (three) times daily. 12/08/17  Yes Weber, Sarah L, PA-C  metroNIDAZOLE (FLAGYL) 500 MG tablet Take 500 mg by mouth every 12 (twelve) hours. 01/25/18 02/01/18 Yes [provider]  ibuprofen (ADVIL,MOTRIN) 600 MG tablet Take 1 tablet (600 mg total) by mouth every 8 (eight) hours as  needed. 01/28/18   Lisa Roca, MD  ondansetron (ZOFRAN) 4 MG tablet Take 1 tablet (4 mg total) by mouth every 8 (eight) hours as needed for nausea or vomiting. 01/28/18   Lisa Roca, MD    Allergies  Allergen Reactions  . Penicillins     unknown    Family History  Problem Relation Age of Onset  . Diabetes Mother   . Hypertension Mother   . Diabetes Paternal Grandfather     Social History Social History   Tobacco Use  . Smoking status: Former Research scientist (life sciences)  . Smokeless tobacco: Never Used  Substance Use Topics  . Alcohol use: No    Alcohol/week: 0.0 oz  . Drug use: No    Review of Systems  Constitutional: Negative for fever. Eyes: Negative for visual changes. ENT: Negative for sore throat. Cardiovascular: Negative for chest pain. Respiratory: Negative for shortness of breath. Gastrointestinal: Positive as per HPI. Genitourinary: Negative for dysuria. Musculoskeletal: Negative for back pain. Skin: Negative for rash. Neurological: Negative for headache.  ____________________________________________   PHYSICAL EXAM:  VITAL SIGNS: ED Triage Vitals  Enc Vitals Group     BP 01/28/18 0313 122/66     Pulse Rate 01/28/18 0313 95     Resp 01/28/18 0313 18     Temp 01/28/18 0313 97.6 F (36.4 C)     Temp Source 01/28/18 0313 Oral     SpO2 01/28/18 0313 100 %     Weight 01/28/18 0314 113 lb (51.3 kg)  Height 01/28/18 0314 5\' 4"  (1.626 m)     Head Circumference --      Peak Flow --      Pain Score 01/28/18 0314 4     Pain Loc --      Pain Edu? --      Excl. in [place]? --      Constitutional: Alert and oriented. Well appearing and in no distress. HEENT   Head: Normocephalic and atraumatic.      Eyes: Conjunctivae are normal. Pupils equal and round.       Ears:         Nose: No congestion/rhinnorhea.   Mouth/Throat: Mucous membranes are moist.   Neck: No stridor. Cardiovascular/Chest: Normal rate, regular rhythm.  No murmurs, rubs, or  gallops. Respiratory: Normal respiratory effort without tachypnea nor retractions. Breath sounds are clear and equal bilaterally. No wheezes/rales/rhonchi. Gastrointestinal: Soft. No distention, no guarding, no rebound.  Mild tenderness mid abdomen and left lower quadrant and also suprapubic.  No focal McBurney's point tenderness.  No epigastric or right upper quadrant pain. Genitourinary/rectal:Deferred Musculoskeletal: Nontender with normal range of motion in all extremities. No joint effusions.  No lower extremity tenderness.  No edema. Neurologic:  Normal speech and language. No gross or focal neurologic deficits are appreciated. Skin:  Skin is warm, dry and intact. No rash noted. Psychiatric: Mood and affect are normal. Speech and behavior are normal. Patient exhibits appropriate insight and judgment.   ____________________________________________  LABS (pertinent positives/negatives) I, Lisa Roca, MD the attending physician have reviewed the labs noted below.  Labs Reviewed  COMPREHENSIVE METABOLIC PANEL - Abnormal; Notable for the following components:      Result Value   Glucose, Bld 117 (*)    ALT 10 (*)    All other components within normal limits  CBC - Abnormal; Notable for the following components:   MCV 78.6 (*)    All other components within normal limits  URINALYSIS, COMPLETE (UACMP) WITH MICROSCOPIC - Abnormal; Notable for the following components:   Color, Urine YELLOW (*)    APPearance HAZY (*)    Ketones, ur 20 (*)    Leukocytes, UA TRACE (*)    Squamous Epithelial / LPF 6-30 (*)    All other components within normal limits  LIPASE, BLOOD  HCG, QUANTITATIVE, PREGNANCY   ____________________________________________  RADIOLOGY   Pelvic ultrasound with transvaginal and Doppler: Interpreted by radiologist:    CLINICAL DATA: Lower abdominal pain for 3 days.  EXAM: TRANSABDOMINAL ULTRASOUND OF PELVIS  DOPPLER ULTRASOUND OF  OVARIES  TECHNIQUE: Transabdominal ultrasound examination of the pelvis was performed including evaluation of the uterus, ovaries, adnexal regions, and pelvic cul-de-sac.  Color and duplex Doppler ultrasound was utilized to evaluate blood flow to the ovaries.  COMPARISON: None.  FINDINGS: Uterus  Measurements: 7.6 x 2.7 x 4.6 cm. No fibroids or other mass visualized.  Endometrium  Thickness: 5 mm. No focal abnormality visualized.  Right ovary  Measurements: 4.8 x 4.1 x 2.6 cm. Contains a 2.6 x 2.3 x 2.9 cm cyst/follicle.  Left ovary  Measurements: 8.7 x 7.2 x 9.7 cm. Contains a large cyst measuring 8.1 x 6.8 x 8.4 cm. There is a thin rim of surrounding ovarian tissue which demonstrates blood flow.  Pulsed Doppler evaluation demonstrates normal low-resistance arterial and venous waveforms in both ovaries.  IMPRESSION: 1. There is a large cyst in the left ovary measuring 8.1 x 6.8 x 8.4 cm. Since these may be difficult to assess completely with [place],  further evaluation of simple-appearing cysts >7 cm with MRI or surgical evaluation is recommended according to the Society of Radiologists in Ultrasound 2010 Consensus Conference Statement (D Clovis Riley et al. Management of Asymptomatic Ovarian and other Adnexal Cysts Imaged at [place]: Society of Radiologists in [place] Statement 2010. Radiology 256 (Sept 2010): 943-954.). 2. There is a 2.9 cm cyst in the right ovary requiring no follow-up. 3. No other acute abnormalities.      __________________________________________  PROCEDURES  Procedure(s) performed: None  Procedures  Critical Care performed: None   ____________________________________________  ED COURSE / ASSESSMENT AND PLAN  Pertinent labs & imaging results that were available during my care of the patient were reviewed by me and considered in my medical decision making (see chart for details).   Patient just had a pelvic exam,  will hold off on repeating that.  She took medication for positive chlamydia it sounds like and was discharged with Flagyl and I am to have her go ahead and restart that.  Her exam is overall reassuring and I have low suspicion for intra-abdominal emergency such as appendicitis or obstruction or TOA, however I am going to obtain a pelvic ultrasound to look for any signs of ovarian cyst or torsion or TOA.  We discussed I am not recommending CT imaging of the abdomen at this time.  We did agreed to do x-ray just to make sure that there is no additional concerning findings.  Laboratory studies are overall reassuring with no elevated white blood cell count, hemoglobin of 12, electrolytes normal.  It sure seems like the vomiting may have been related to the antibiotic, however given the abdominal cramping and now the vomiting with some nausea, it may be that this actually is a GI viral illness as this is extremely common in the community right now.   Ultrasound shows small right-sided ovarian cyst, but very large left-sided ovarian cyst.  Possible her pain which is mostly on the left lower quadrant is coming from that left ovarian cyst.  No sign of torsion.  We discussed as radiologist report notes, likely recommend further imaging with MRI.  I am going to also refer her for follow-up this week with OB/GYN.  I also printed out and gave her report as well.   CONSULTATIONS:   None   Patient / Family / Caregiver informed of clinical course, medical decision-making process, and agree with plan.   I discussed return precautions, follow-up instructions, and discharge instructions with patient and/or family.  Discharge Instructions : You are evaluated for nausea and vomiting as well as abdominal pain and as we discussed, severe symptoms seem more so related to possible viral GI illness.  However, your ultrasound did show a small right ovarian cyst, and a large left-sided ovarian cyst.  It is  possible that your lower pelvic discomfort is from the cyst.  The one on the left side is fairly large and will likely need additional imaging, likely an MRI to be ordered either by her primary care doctor or an OB/GYN doctor, and you are referred to see in follow-up with OB/GYN.  Return to the emergency room immediately for any worsening condition including uncontrolled pain, continued vomiting concern for dehydration such as not making urine or dry mouth, dizziness or passing out, or any other symptoms concerning to you.    ___________________________________________   FINAL CLINICAL IMPRESSION(S) / ED DIAGNOSES   Final diagnoses:  Abdominal pain  Nausea and vomiting, intractability of vomiting not specified, unspecified  vomiting type  Right ovarian cyst  Ovarian cyst, left      ___________________________________________        Note: This dictation was prepared with Dragon dictation. Any transcriptional errors that result from this process are unintentional    Lisa Roca, MD 01/28/18 1455

## 2018-01-28 NOTE — Discharge Instructions (Addendum)
You are evaluated for nausea and vomiting as well as abdominal pain and as we discussed, severe symptoms seem more so related to possible viral GI illness.  However, your ultrasound did show a small right ovarian cyst, and a large left-sided ovarian cyst.  It is possible that your lower pelvic discomfort is from the cyst.  The one on the left side is fairly large and will likely need additional imaging, likely an MRI to be ordered either by her primary care doctor or an OB/GYN doctor, and you are referred to see in follow-up with OB/GYN.  Return to the emergency room immediately for any worsening condition including uncontrolled pain, continued vomiting concern for dehydration such as not making urine or dry mouth, dizziness or passing out, or any other symptoms concerning to you.

## 2018-01-28 NOTE — ED Notes (Signed)
Patient transported to X-ray 

## 2018-01-28 NOTE — ED Triage Notes (Signed)
Patient ambulatory to triage with complaints of abdominal and pelvic pain since Thursday, pt reports being seen at urgent care at that time and dx of chlamydia  and BV, pt stated taking Flagyl and Azithromycin but recently stopped the Flagyl d/t paresthesia  and dizziness.   Pt reports yellow emesis earlier today x 1. Constant dull pain in lower abdomen with nausea. Speaking in complete coherent sentences. No acute breathing distress noted.

## 2018-01-29 ENCOUNTER — Ambulatory Visit: Payer: Self-pay

## 2018-01-30 ENCOUNTER — Other Ambulatory Visit: Payer: Self-pay

## 2018-01-30 ENCOUNTER — Encounter: Payer: Self-pay | Admitting: Physician Assistant

## 2018-01-30 ENCOUNTER — Ambulatory Visit: Payer: 59 | Admitting: Physician Assistant

## 2018-01-30 ENCOUNTER — Ambulatory Visit: Payer: Self-pay

## 2018-01-30 VITALS — BP 102/60 | HR 98 | Temp 98.7°F | Resp 18 | Ht 64.0 in | Wt 111.4 lb

## 2018-01-30 DIAGNOSIS — R102 Pelvic and perineal pain: Secondary | ICD-10-CM | POA: Diagnosis not present

## 2018-01-30 DIAGNOSIS — A749 Chlamydial infection, unspecified: Secondary | ICD-10-CM

## 2018-01-30 DIAGNOSIS — N83202 Unspecified ovarian cyst, left side: Secondary | ICD-10-CM

## 2018-01-30 NOTE — Patient Instructions (Signed)
     IF you received an x-ray today, you will receive an invoice from [facility]. Please contact [facility] at [phone] with questions or concerns regarding your invoice.   IF you received labwork today, you will receive an invoice from [facility]. Please contact [facility] at [phone] with questions or concerns regarding your invoice.   Our billing staff will not be able to assist you with questions regarding bills from these companies.  You will be contacted with the lab results as soon as they are available. The fastest way to get your results is to activate your My Chart account. Instructions are located on the last page of this paperwork. If you have not heard from us regarding the results in 2 weeks, please contact this office.     

## 2018-01-30 NOTE — Progress Notes (Signed)
Niger J Stenzel  MRN: [id number] DOB: [birth year]  PCP: Mancel Bale, PA-C  Chief Complaint  Patient presents with  . Hospitalization Follow-up    Subjective:  Pt presents to clinic for f/u from ED visit on Sat night.  She worked all day and then at evening about 7pm she took Zithromax 1g (to treart chalmydia she was just diagnosed with) - about 2 hours later she started to get nauseated - then 4 hours later she started to have multuiple episodes of emesis.  She did have generalized abd pain duiring this time.  She had diarrhea yesterday but none prior to her ED visit.  At the ED they did an [place] because she had been having for the last week significant LLQ pain - and they found a 6x8x6 left ovarian cyst.  Today she has had no nausea or vomiting or diarrhea.  She is not having pelvic pain.  She is currently not on OCP. She has never had ovarian cysts.  She sees Dr Garwin Brothers for GYN but has not been able to get an appt.  Reviewed records and results from ED visit - unable to see UC visit where she was diagnosed with clalmydia due to outside this hospital system.  LMP - 3/8  History is obtained by patient .  Review of Systems  Constitutional: Negative for chills and fever.  Gastrointestinal: Positive for abdominal pain (LLQ - not currently today).  Psychiatric/Behavioral: Negative for dysphoric mood and sleep disturbance. The patient is nervous/anxious.     Patient Active Problem List   Diagnosis Date Noted  . Anxiety state 12/08/2017  . History of iron deficiency anemia 12/19/2011    Current Outpatient Medications on File Prior to Visit  Medication Sig Dispense Refill  . hydrOXYzine (ATARAX/VISTARIL) 25 MG tablet Take 1 tablet (25 mg total) by mouth 3 (three) times daily. 60 tablet 5  . ibuprofen (ADVIL,MOTRIN) 600 MG tablet Take 1 tablet (600 mg total) by mouth every 8 (eight) hours as needed. 20 tablet 0  . ondansetron (ZOFRAN) 4 MG tablet Take 1 tablet (4 mg total) by mouth every  8 (eight) hours as needed for nausea or vomiting. 10 tablet 0  . metroNIDAZOLE (FLAGYL) 500 MG tablet Take 500 mg by mouth every 12 (twelve) hours.  0   No current facility-administered medications on file prior to visit.     Allergies  Allergen Reactions  . Flagyl [Metronidazole]     Numbness and dizziness   . Penicillins     unknown    Past Medical History:  Diagnosis Date  . Allergy   . Anemia   . Iron deficiency anemia 12/19/2011   Social History   Social History Narrative   Marital status: single; not dating      Children: none      Lives: with mom, dad in [place]      Employment: [employer] - at [employer] - graduated      Tobacco: none      Alcohol; none      Drug: none      Exercise: none   Social History   Tobacco Use  . Smoking status: Former Research scientist (life sciences)  . Smokeless tobacco: Never Used  Substance Use Topics  . Alcohol use: No    Alcohol/week: 0.0 oz  . Drug use: No   family history includes Diabetes in her mother and paternal grandfather; Hypertension in  her mother.     Objective:  BP 102/60   Pulse 98   Temp 98.7 F (37.1 C) (Oral)   Resp 18   Ht 5\' 4"  (1.626 m)   Wt 111 lb 6.4 oz (50.5 kg)   LMP 01/10/2018   SpO2 100%   BMI 19.12 kg/m  Body mass index is 19.12 kg/m.  Physical Exam  Constitutional: She is oriented to person, place, and time and well-developed, well-nourished, and in no distress.  HENT:  Head: Normocephalic and atraumatic.  Right Ear: Hearing and external ear normal.  Left Ear: Hearing and external ear normal.  Eyes: Conjunctivae are normal.  Neck: Normal range of motion.  Cardiovascular: Normal rate, regular rhythm and normal heart sounds.  No murmur heard. Pulmonary/Chest: Effort normal and breath sounds normal. She has no wheezes.  Abdominal: Soft. Bowel sounds are normal. There is no tenderness.  Neurological: She is alert and oriented to person,  place, and time. Gait normal.  Skin: Skin is warm and dry.  Psychiatric: Mood, memory, affect and judgment normal.  Vitals reviewed.   Assessment and Plan :  Pelvic pain in female - Plan: Ambulatory referral to Gynecology - likely from ovarian cyst but she did just get treated for Chlamydia and thinks that she kept down the abx but due to know knowing encouraged test for cure in about a month  Left ovarian cyst - Plan: Ambulatory referral to Gynecology - due to size needs appt sooner than she could get with calling the office - did a referral in hopes that she can be seen before she starts her new job next week due to no time off for office visits.  Due to size of cyst d/w pt warning signs and when to RTC sooner if she has not yet seen GYN.  Chlamydia infection -   Windell Hummingbird PA-C  [facility] 01/30/2018 4:36 PM

## 2018-02-06 ENCOUNTER — Telehealth: Payer: Self-pay | Admitting: Obstetrics and Gynecology

## 2018-02-06 NOTE — Telephone Encounter (Signed)
Called and left a message for patient to call back to schedule a new patient doctor referral appointment with our office for pelvic pain and ovarian cyst.

## 2018-02-08 NOTE — Telephone Encounter (Signed)
Called and left a message for patient to call back to schedule a new patient doctor referral appointment with our office for pelvic pain and ovarian cyst.

## 2018-02-09 NOTE — Telephone Encounter (Signed)
Called and left a message for patient to call back to schedule a new patient doctor referral appointment with our officefor pelvic pain and ovarian cyst.

## 2018-02-13 NOTE — Telephone Encounter (Signed)
° °  Routing referral back to referring office since patient has not returned multiple calls to schedule an appointment with our office.

## 2018-02-14 ENCOUNTER — Encounter: Payer: Self-pay | Admitting: Physician Assistant

## 2018-02-20 DIAGNOSIS — N83202 Unspecified ovarian cyst, left side: Secondary | ICD-10-CM | POA: Diagnosis not present

## 2018-02-24 DIAGNOSIS — H52223 Regular astigmatism, bilateral: Secondary | ICD-10-CM | POA: Diagnosis not present

## 2018-02-24 DIAGNOSIS — H5213 Myopia, bilateral: Secondary | ICD-10-CM | POA: Diagnosis not present

## 2018-03-14 ENCOUNTER — Other Ambulatory Visit: Payer: Self-pay | Admitting: Obstetrics and Gynecology

## 2018-03-14 DIAGNOSIS — N83202 Unspecified ovarian cyst, left side: Secondary | ICD-10-CM

## 2018-03-16 ENCOUNTER — Other Ambulatory Visit: Payer: Self-pay | Admitting: Obstetrics and Gynecology

## 2018-03-20 ENCOUNTER — Other Ambulatory Visit: Payer: 59

## 2018-03-22 DIAGNOSIS — R1032 Left lower quadrant pain: Secondary | ICD-10-CM | POA: Diagnosis not present

## 2018-03-22 DIAGNOSIS — N83202 Unspecified ovarian cyst, left side: Secondary | ICD-10-CM | POA: Diagnosis not present

## 2018-03-22 NOTE — Patient Instructions (Addendum)
Niger J Villegas  03/22/2018   Your procedure is scheduled on: 03-29-18   Report to [facility] Main  Entrance    Report to Admitting at 11:00 AM    Call this number if you have problems the morning of surgery [phone]   Remember: Do not eat food or drink liquids :After Midnight.You may have a Clear Liquid Diet from Midnight until 7:00 AM. After 7:00 AM, nothing until after surgery.     CLEAR LIQUID DIET   Foods Allowed                                                                     Foods Excluded  Coffee and tea, regular and decaf                             liquids that you cannot  Plain Jell-O in any flavor                                             see through such as: Fruit ices (not with fruit pulp)                                     milk, soups, orange juice  Iced Popsicles                                    All solid food Carbonated beverages, regular and diet                                    Cranberry, grape and apple juices Sports drinks like Gatorade Lightly seasoned clear broth or consume(fat free) Sugar, honey syrup  Sample Menu Breakfast                                Lunch                                     Supper Cranberry juice                    Beef broth                            Chicken broth Jell-O                                     Grape juice  Apple juice Coffee or tea                        Jell-O                                      Popsicle                                                Coffee or tea                        Coffee or tea  _____________________________________________________________________     Take these medicines the morning of surgery with A SIP OF WATER: None                                You may not have any metal on your body including hair pins and              piercings  Do not wear jewelry, make-up, lotions, powders or perfumes, deodorant             Do not  wear nail polish.  Do not shave  48 hours prior to surgery.               Do not bring valuables to the hospital. [place].  Contacts, dentures or bridgework may not be worn into surgery.     Patients discharged the day of surgery will not be allowed to drive home.  Name and phone number of your driver: Daliya Parchment [phone]               Please read over the following fact sheets you were given: _____________________________________________________________________          [facility] - Preparing for Surgery Before surgery, you can play an important role.  Because skin is not sterile, your skin needs to be as free of germs as possible.  You can reduce the number of germs on your skin by washing with CHG (chlorahexidine gluconate) soap before surgery.  CHG is an antiseptic cleaner which kills germs and bonds with the skin to continue killing germs even after washing. Please DO NOT use if you have an allergy to CHG or antibacterial soaps.  If your skin becomes reddened/irritated stop using the CHG and inform your nurse when you arrive at Short Stay. Do not shave (including legs and underarms) for at least 48 hours prior to the first CHG shower.  You may shave your face/neck. Please follow these instructions carefully:  1.  Shower with CHG Soap the night before surgery and the  morning of Surgery.  2.  If you choose to wash your hair, wash your hair first as usual with your  normal  shampoo.  3.  After you shampoo, rinse your hair and body thoroughly to remove the  shampoo.                           4.  Use CHG as you would any other  liquid soap.  You can apply chg directly  to the skin and wash                       Gently with a scrungie or clean washcloth.  5.  Apply the CHG Soap to your body ONLY FROM THE NECK DOWN.   Do not use on face/ open                           Wound or open sores. Avoid contact with eyes, ears mouth and  genitals (private parts).                       Wash face,  Genitals (private parts) with your normal soap.             6.  Wash thoroughly, paying special attention to the area where your surgery  will be performed.  7.  Thoroughly rinse your body with warm water from the neck down.  8.  DO NOT shower/wash with your normal soap after using and rinsing off  the CHG Soap.                9.  Pat yourself dry with a clean towel.            10.  Wear clean pajamas.            11.  Place clean sheets on your bed the night of your first shower and do not  sleep with pets. Day of Surgery : Do not apply any lotions/deodorants the morning of surgery.  Please wear clean clothes to the hospital/surgery center.  FAILURE TO FOLLOW THESE INSTRUCTIONS MAY RESULT IN THE CANCELLATION OF YOUR SURGERY PATIENT SIGNATURE_________________________________  NURSE SIGNATURE__________________________________  ________________________________________________________________________

## 2018-03-23 ENCOUNTER — Other Ambulatory Visit: Payer: Self-pay

## 2018-03-23 ENCOUNTER — Encounter (HOSPITAL_COMMUNITY)
Admission: RE | Admit: 2018-03-23 | Discharge: 2018-03-23 | Disposition: A | Discharge status: Home / Self Care | Payer: 59 | Source: Ambulatory Visit | Attending: Obstetrics and Gynecology | Admitting: Obstetrics and Gynecology

## 2018-03-23 ENCOUNTER — Encounter (HOSPITAL_COMMUNITY): Payer: Self-pay

## 2018-03-23 DIAGNOSIS — Z01812 Encounter for preprocedural laboratory examination: Secondary | ICD-10-CM | POA: Insufficient documentation

## 2018-03-23 DIAGNOSIS — N83202 Unspecified ovarian cyst, left side: Secondary | ICD-10-CM | POA: Diagnosis not present

## 2018-03-23 DIAGNOSIS — Z0183 Encounter for blood typing: Secondary | ICD-10-CM | POA: Diagnosis not present

## 2018-03-23 LAB — CBC
HCT: 34.9 % — ABNORMAL LOW (ref 36.0–46.0)
Hemoglobin: 10.5 g/dL — ABNORMAL LOW (ref 12.0–15.0)
MCH: 24.5 pg — ABNORMAL LOW (ref 26.0–34.0)
MCHC: 30.1 g/dL (ref 30.0–36.0)
MCV: 81.4 fL (ref 78.0–100.0)
PLATELETS: 278 10*3/uL (ref 150–400)
RBC: 4.29 MIL/uL (ref 3.87–5.11)
RDW: 13.7 % (ref 11.5–15.5)
WBC: 6 10*3/uL (ref 4.0–10.5)

## 2018-03-23 LAB — ABO/RH: ABO/RH(D): O POS

## 2018-03-23 LAB — HCG, SERUM, QUALITATIVE: Preg, Serum: NEGATIVE

## 2018-03-29 ENCOUNTER — Emergency Department (HOSPITAL_COMMUNITY)
Admission: EM | Admit: 2018-03-29 | Discharge: 2018-03-29 | Disposition: A | Discharge status: Home / Self Care | Payer: 59 | Attending: Emergency Medicine | Admitting: Emergency Medicine

## 2018-03-29 ENCOUNTER — Ambulatory Visit (HOSPITAL_COMMUNITY): Payer: 59 | Admitting: Certified Registered Nurse Anesthetist

## 2018-03-29 ENCOUNTER — Other Ambulatory Visit: Payer: Self-pay

## 2018-03-29 ENCOUNTER — Ambulatory Visit (HOSPITAL_COMMUNITY)
Admission: RE | Admit: 2018-03-29 | Discharge: 2018-03-29 | Disposition: A | Discharge status: Home / Self Care | Payer: 59 | Source: Ambulatory Visit | Attending: Obstetrics and Gynecology | Admitting: Obstetrics and Gynecology

## 2018-03-29 ENCOUNTER — Encounter (HOSPITAL_COMMUNITY): Payer: Self-pay | Admitting: Emergency Medicine

## 2018-03-29 ENCOUNTER — Encounter (HOSPITAL_COMMUNITY)
Admission: RE | Disposition: A | Discharge status: Home / Self Care | Payer: Self-pay | Source: Ambulatory Visit | Attending: Obstetrics and Gynecology

## 2018-03-29 DIAGNOSIS — T7840XA Allergy, unspecified, initial encounter: Secondary | ICD-10-CM | POA: Diagnosis not present

## 2018-03-29 DIAGNOSIS — Z87891 Personal history of nicotine dependence: Secondary | ICD-10-CM | POA: Insufficient documentation

## 2018-03-29 DIAGNOSIS — N83201 Unspecified ovarian cyst, right side: Secondary | ICD-10-CM | POA: Diagnosis not present

## 2018-03-29 DIAGNOSIS — Z79899 Other long term (current) drug therapy: Secondary | ICD-10-CM | POA: Insufficient documentation

## 2018-03-29 DIAGNOSIS — F419 Anxiety disorder, unspecified: Secondary | ICD-10-CM | POA: Diagnosis not present

## 2018-03-29 DIAGNOSIS — D649 Anemia, unspecified: Secondary | ICD-10-CM | POA: Diagnosis not present

## 2018-03-29 DIAGNOSIS — N803 Endometriosis of pelvic peritoneum: Secondary | ICD-10-CM | POA: Diagnosis not present

## 2018-03-29 DIAGNOSIS — D271 Benign neoplasm of left ovary: Secondary | ICD-10-CM | POA: Insufficient documentation

## 2018-03-29 DIAGNOSIS — N83202 Unspecified ovarian cyst, left side: Secondary | ICD-10-CM | POA: Diagnosis not present

## 2018-03-29 HISTORY — DX: Endometriosis, unspecified: N80.9

## 2018-03-29 HISTORY — PX: ROBOTIC ASSISTED LAPAROSCOPIC OVARIAN CYSTECTOMY: SHX6081

## 2018-03-29 LAB — TYPE AND SCREEN
ABO/RH(D): O POS
Antibody Screen: NEGATIVE

## 2018-03-29 SURGERY — EXCISION, CYST, OVARY, ROBOT-ASSISTED, LAPAROSCOPIC
Anesthesia: General | Site: Abdomen | Laterality: Left

## 2018-03-29 MED ORDER — ARTIFICIAL TEARS OPHTHALMIC OINT
TOPICAL_OINTMENT | OPHTHALMIC | Status: AC
  Filled 2018-03-29: qty 3.5

## 2018-03-29 MED ORDER — SUCCINYLCHOLINE CHLORIDE 200 MG/10ML IV SOSY
PREFILLED_SYRINGE | INTRAVENOUS | Status: DC | PRN
  Administered 2018-03-29: 20 mg via INTRAVENOUS

## 2018-03-29 MED ORDER — FENTANYL CITRATE (PF) 100 MCG/2ML IJ SOLN
INTRAMUSCULAR | Status: DC | PRN
  Administered 2018-03-29: 50 ug via INTRAVENOUS
  Administered 2018-03-29: 100 ug via INTRAVENOUS
  Administered 2018-03-29 (×4): 50 ug via INTRAVENOUS

## 2018-03-29 MED ORDER — DEXAMETHASONE SODIUM PHOSPHATE 4 MG/ML IJ SOLN: INTRAMUSCULAR | Status: DC | PRN

## 2018-03-29 MED ORDER — TRAMADOL HCL 50 MG PO TABS: 50.0000 mg | ORAL_TABLET | Freq: Four times a day (QID) | ORAL | 0 refills | Status: AC | PRN

## 2018-03-29 MED ORDER — SODIUM CHLORIDE 0.9 % IR SOLN
Status: DC | PRN
Start: 2018-03-29 — End: 2018-03-29
  Administered 2018-03-29: 1000 mL

## 2018-03-29 MED ORDER — FENTANYL CITRATE (PF) 100 MCG/2ML IJ SOLN
INTRAMUSCULAR | Status: AC
  Filled 2018-03-29: qty 2

## 2018-03-29 MED ORDER — SODIUM CHLORIDE 0.9 % IJ SOLN
INTRAMUSCULAR | Status: AC
  Filled 2018-03-29: qty 10

## 2018-03-29 MED ORDER — LIDOCAINE 2% (20 MG/ML) 5 ML SYRINGE
INTRAMUSCULAR | Status: DC | PRN
  Administered 2018-03-29: 50 mg via INTRAVENOUS

## 2018-03-29 MED ORDER — KETOROLAC TROMETHAMINE 30 MG/ML IJ SOLN: 30.0000 mg | Freq: Once | INTRAMUSCULAR | Status: DC | PRN

## 2018-03-29 MED ORDER — MEPERIDINE HCL 50 MG/ML IJ SOLN: 6.2500 mg | INTRAMUSCULAR | Status: DC | PRN

## 2018-03-29 MED ORDER — ONDANSETRON HCL 4 MG PO TABS
4.0000 mg | ORAL_TABLET | Freq: Once | ORAL | Status: AC
  Administered 2018-03-29: 4 mg via ORAL
  Filled 2018-03-29: qty 1

## 2018-03-29 MED ORDER — ONDANSETRON HCL 4 MG/2ML IJ SOLN
INTRAMUSCULAR | Status: AC
  Filled 2018-03-29: qty 2

## 2018-03-29 MED ORDER — LACTATED RINGERS IV SOLN
INTRAVENOUS | Status: DC
  Administered 2018-03-29 (×2): via INTRAVENOUS

## 2018-03-29 MED ORDER — HYDROCODONE-ACETAMINOPHEN 5-325 MG PO TABS: 1.0000 | ORAL_TABLET | Freq: Four times a day (QID) | ORAL | 0 refills | Status: DC | PRN

## 2018-03-29 MED ORDER — BUPIVACAINE HCL (PF) 0.25 % IJ SOLN
INTRAMUSCULAR | Status: AC
  Filled 2018-03-29: qty 30

## 2018-03-29 MED ORDER — SUGAMMADEX SODIUM 200 MG/2ML IV SOLN
INTRAVENOUS | Status: AC
  Filled 2018-03-29: qty 2

## 2018-03-29 MED ORDER — DEXAMETHASONE SODIUM PHOSPHATE 10 MG/ML IJ SOLN
INTRAMUSCULAR | Status: DC | PRN
  Administered 2018-03-29: 5 mg via INTRAVENOUS

## 2018-03-29 MED ORDER — HYDROCODONE-ACETAMINOPHEN 5-325 MG PO TABS: 1.0000 | ORAL_TABLET | Freq: Once | ORAL | Status: DC

## 2018-03-29 MED ORDER — HYDROMORPHONE HCL 1 MG/ML IJ SOLN
0.2500 mg | INTRAMUSCULAR | Status: DC | PRN
  Administered 2018-03-29: 0.25 mg via INTRAVENOUS
  Administered 2018-03-29: 0.5 mg via INTRAVENOUS
  Administered 2018-03-29: 0.25 mg via INTRAVENOUS
  Administered 2018-03-29 (×2): 0.5 mg via INTRAVENOUS

## 2018-03-29 MED ORDER — KETOROLAC TROMETHAMINE 30 MG/ML IJ SOLN
INTRAMUSCULAR | Status: AC
  Filled 2018-03-29: qty 1

## 2018-03-29 MED ORDER — PROPOFOL 10 MG/ML IV BOLUS
INTRAVENOUS | Status: DC | PRN
  Administered 2018-03-29: 130 mg via INTRAVENOUS

## 2018-03-29 MED ORDER — LIDOCAINE 2% (20 MG/ML) 5 ML SYRINGE
INTRAMUSCULAR | Status: AC
  Filled 2018-03-29: qty 5

## 2018-03-29 MED ORDER — ONDANSETRON HCL 4 MG/2ML IJ SOLN
INTRAMUSCULAR | Status: DC | PRN
  Administered 2018-03-29: 4 mg via INTRAVENOUS

## 2018-03-29 MED ORDER — MIDAZOLAM HCL 2 MG/2ML IJ SOLN
INTRAMUSCULAR | Status: AC
  Filled 2018-03-29: qty 2

## 2018-03-29 MED ORDER — SCOPOLAMINE 1 MG/3DAYS TD PT72
MEDICATED_PATCH | TRANSDERMAL | Status: AC
  Filled 2018-03-29: qty 1

## 2018-03-29 MED ORDER — PROPOFOL 10 MG/ML IV BOLUS
INTRAVENOUS | Status: AC
  Filled 2018-03-29: qty 20

## 2018-03-29 MED ORDER — HYDROMORPHONE HCL 1 MG/ML IJ SOLN
INTRAMUSCULAR | Status: AC
  Filled 2018-03-29: qty 1

## 2018-03-29 MED ORDER — PROMETHAZINE HCL 25 MG/ML IJ SOLN: 6.2500 mg | INTRAMUSCULAR | Status: DC | PRN

## 2018-03-29 MED ORDER — NALOXONE HCL 0.4 MG/ML IJ SOLN
INTRAMUSCULAR | Status: DC | PRN
  Administered 2018-03-29 (×2): 40 ug via INTRAVENOUS

## 2018-03-29 MED ORDER — DIPHENHYDRAMINE HCL 50 MG/ML IJ SOLN
25.0000 mg | Freq: Once | INTRAMUSCULAR | Status: AC
  Administered 2018-03-29: 25 mg via INTRAMUSCULAR
  Filled 2018-03-29: qty 1

## 2018-03-29 MED ORDER — FENTANYL CITRATE (PF) 250 MCG/5ML IJ SOLN
INTRAMUSCULAR | Status: AC
  Filled 2018-03-29: qty 5

## 2018-03-29 MED ORDER — SUGAMMADEX SODIUM 200 MG/2ML IV SOLN
INTRAVENOUS | Status: DC | PRN
  Administered 2018-03-29: 110 mg via INTRAVENOUS

## 2018-03-29 MED ORDER — MIDAZOLAM HCL 5 MG/5ML IJ SOLN
INTRAMUSCULAR | Status: DC | PRN
  Administered 2018-03-29: 2 mg via INTRAVENOUS

## 2018-03-29 MED ORDER — HYDROCODONE-ACETAMINOPHEN 5-325 MG PO TABS
ORAL_TABLET | ORAL | Status: AC
  Administered 2018-03-29: 1
  Filled 2018-03-29: qty 1

## 2018-03-29 MED ORDER — ROCURONIUM BROMIDE 10 MG/ML (PF) SYRINGE
PREFILLED_SYRINGE | INTRAVENOUS | Status: DC | PRN
  Administered 2018-03-29: 40 mg via INTRAVENOUS
  Administered 2018-03-29 (×2): 10 mg via INTRAVENOUS

## 2018-03-29 MED ORDER — ROCURONIUM BROMIDE 10 MG/ML (PF) SYRINGE
PREFILLED_SYRINGE | INTRAVENOUS | Status: AC
  Filled 2018-03-29: qty 5

## 2018-03-29 MED ORDER — BUPIVACAINE HCL (PF) 0.25 % IJ SOLN
INTRAMUSCULAR | Status: DC | PRN
  Administered 2018-03-29: 6 mL

## 2018-03-29 MED ORDER — SCOPOLAMINE 1 MG/3DAYS TD PT72
1.0000 | MEDICATED_PATCH | Freq: Once | TRANSDERMAL | Status: DC
  Administered 2018-03-29: 1.5 mg via TRANSDERMAL

## 2018-03-29 MED ORDER — DEXAMETHASONE SODIUM PHOSPHATE 10 MG/ML IJ SOLN
INTRAMUSCULAR | Status: AC
  Filled 2018-03-29: qty 1

## 2018-03-29 MED ORDER — NALOXONE HCL 0.4 MG/ML IJ SOLN
INTRAMUSCULAR | Status: AC
  Filled 2018-03-29: qty 1

## 2018-03-29 SURGICAL SUPPLY — 56 items
APPLICATOR ARISTA FLEXITIP XL (MISCELLANEOUS) ×2 IMPLANT
BARRIER ADHS 3X4 INTERCEED (GAUZE/BANDAGES/DRESSINGS) IMPLANT
CANISTER SUCT 3000ML PPV (MISCELLANEOUS) ×2 IMPLANT
CATH FOLEY 3WAY  5CC 16FR (CATHETERS) ×1
CATH FOLEY 3WAY 5CC 16FR (CATHETERS) ×1 IMPLANT
COVER BACK TABLE 60X90IN (DRAPES) ×2 IMPLANT
COVER TIP SHEARS 8 DVNC (MISCELLANEOUS) ×1 IMPLANT
COVER TIP SHEARS 8MM DA VINCI (MISCELLANEOUS) ×1
DECANTER SPIKE VIAL GLASS SM (MISCELLANEOUS) ×2 IMPLANT
DEFOGGER SCOPE WARMER CLEARIFY (MISCELLANEOUS) ×2 IMPLANT
DERMABOND ADVANCED (GAUZE/BANDAGES/DRESSINGS) ×1
DERMABOND ADVANCED .7 DNX12 (GAUZE/BANDAGES/DRESSINGS) ×1 IMPLANT
DRAPE ARM DVNC X/XI (DISPOSABLE) ×4 IMPLANT
DRAPE COLUMN DVNC XI (DISPOSABLE) ×1 IMPLANT
DRAPE DA VINCI XI ARM (DISPOSABLE) ×4
DRAPE DA VINCI XI COLUMN (DISPOSABLE) ×1
DURAPREP 26ML APPLICATOR (WOUND CARE) ×2 IMPLANT
ELECT REM PT RETURN 15FT ADLT (MISCELLANEOUS) ×2 IMPLANT
GLOVE BIOGEL PI IND STRL 7.0 (GLOVE) ×5 IMPLANT
GLOVE BIOGEL PI INDICATOR 7.0 (GLOVE) ×5
GLOVE ECLIPSE 6.5 STRL STRAW (GLOVE) ×6 IMPLANT
HEMOSTAT ARISTA ABSORB 3G PWDR (MISCELLANEOUS) ×2 IMPLANT
IRRIG SUCT STRYKERFLOW 2 WTIP (MISCELLANEOUS) ×2
IRRIGATION SUCT STRKRFLW 2 WTP (MISCELLANEOUS) ×1 IMPLANT
NEEDLE INSUFFLATION 120MM (ENDOMECHANICALS) ×2 IMPLANT
NEEDLE INSUFFLATION 150MM (ENDOMECHANICALS) IMPLANT
OBTURATOR OPTICAL STANDARD 8MM (TROCAR)
OBTURATOR OPTICAL STND 8 DVNC (TROCAR)
OBTURATOR OPTICALSTD 8 DVNC (TROCAR) IMPLANT
OCCLUDER COLPOPNEUMO (BALLOONS) IMPLANT
PACK ROBOT WH (CUSTOM PROCEDURE TRAY) ×2 IMPLANT
PACK ROBOTIC GOWN (GOWN DISPOSABLE) ×2 IMPLANT
PACK TRENDGUARD 450 HYBRID PRO (MISCELLANEOUS) ×1 IMPLANT
PAD PREP 24X48 CUFFED NSTRL (MISCELLANEOUS) ×2 IMPLANT
POSITIONER SURGICAL ARM (MISCELLANEOUS) ×4 IMPLANT
SEAL CANN UNIV 5-8 DVNC XI (MISCELLANEOUS) ×3 IMPLANT
SEAL XI 5MM-8MM UNIVERSAL (MISCELLANEOUS) ×3
SEALER VESSEL DA VINCI XI (MISCELLANEOUS)
SEALER VESSEL EXT DVNC XI (MISCELLANEOUS) IMPLANT
SET CYSTO W/LG BORE CLAMP LF (SET/KITS/TRAYS/PACK) IMPLANT
SET TRI-LUMEN FLTR TB AIRSEAL (TUBING) ×2 IMPLANT
SUT VIC AB 0 CT1 36 (SUTURE) IMPLANT
SUT VICRYL 0 UR6 27IN ABS (SUTURE) ×2 IMPLANT
SUT VICRYL 4-0 PS2 18IN ABS (SUTURE) ×4 IMPLANT
SUT VLOC 180 0 9IN  GS21 (SUTURE)
SUT VLOC 180 0 9IN GS21 (SUTURE) IMPLANT
TIP RUMI ORANGE 6.7MMX12CM (TIP) IMPLANT
TIP UTERINE 5.1X6CM LAV DISP (MISCELLANEOUS) IMPLANT
TIP UTERINE 6.7X10CM GRN DISP (MISCELLANEOUS) IMPLANT
TIP UTERINE 6.7X6CM WHT DISP (MISCELLANEOUS) IMPLANT
TIP UTERINE 6.7X8CM BLUE DISP (MISCELLANEOUS) IMPLANT
TOWEL OR 17X26 10 PK STRL BLUE (TOWEL DISPOSABLE) ×4 IMPLANT
TRENDGUARD 450 HYBRID PRO PACK (MISCELLANEOUS) ×2
TROCAR PORT AIRSEAL 5X120 (TROCAR) ×2 IMPLANT
TROCAR PORT AIRSEAL 8X120 (TROCAR) ×2 IMPLANT
WATER STERILE IRR 1000ML POUR (IV SOLUTION) ×2 IMPLANT

## 2018-03-29 NOTE — Transfer of Care (Signed)
Immediate Anesthesia Transfer of Care Note  Patient: Teresa Mclaughlin  Procedure(s) Performed: XI ROBOTIC ASSISTED LAPAROSCOPIC OVARIAN CYSTECTOMY, EXCISION OF PELVIC ENDOMETRIOSIS (Left Abdomen)  Patient Location: PACU  Anesthesia Type:General  Level of Consciousness: drowsy and responds to stimulation  Airway & Oxygen Therapy: Patient Spontanous Breathing and Patient connected to face mask oxygen  Post-op Assessment: Report given to RN  Post vital signs: Reviewed and stable  Last Vitals:  Vitals Value Taken Time  BP 96/70 03/29/2018  4:08 PM  Temp    Pulse 114 03/29/2018  4:13 PM  Resp 17 03/29/2018  4:13 PM  SpO2 100 % 03/29/2018  4:13 PM  Vitals shown include unvalidated device data.  Last Pain:  Vitals:   03/29/18 1606  TempSrc:   PainSc: (P) 7       Patients Stated Pain Goal: (P) 4 (40/35/24 8185)  Complications: No apparent anesthesia complications

## 2018-03-29 NOTE — Anesthesia Postprocedure Evaluation (Signed)
Anesthesia Post Note  Patient: Teresa Mclaughlin  Procedure(s) Performed: XI ROBOTIC ASSISTED LAPAROSCOPIC OVARIAN CYSTECTOMY, EXCISION OF PELVIC ENDOMETRIOSIS (Left Abdomen)     Patient location during evaluation: PACU Anesthesia Type: General Level of consciousness: sedated and patient cooperative Pain management: pain level controlled Vital Signs Assessment: post-procedure vital signs reviewed and stable Respiratory status: spontaneous breathing Cardiovascular status: stable Anesthetic complications: no    Last Vitals:  Vitals:   03/29/18 1655 03/29/18 1845  BP: 122/88 118/76  Pulse: (!) 121 98  Resp: 18 20  Temp: 36.9 C 36.9 C  SpO2: 96% 98%    Last Pain:  Vitals:   03/29/18 1655  TempSrc:   PainSc: [place]

## 2018-03-29 NOTE — Discharge Instructions (Signed)
Warm heat to abdomen every 4 hours x 24 hrs.  CALL  IF TEMP>100.4, NOTHING PER VAGINA X 2 WK, CALL IF SOAKING A MAXI  PAD EVERY HOUR OR MORE FREQUENTLY

## 2018-03-29 NOTE — ED Triage Notes (Signed)
Pt had an ovarian cyst removed by Dr Garwin Brothers today and just left short stay around 7pm tonight  Pt states she was given a vicodin prior to discharge and now she is itching all over  Pt does not hives  Denies difficulty breathing or swallowing

## 2018-03-29 NOTE — ED Provider Notes (Signed)
[facility] DEPT Provider Note  CSN: [id number] Arrival date & time: 03/29/18  1934  History   Chief Complaint Chief Complaint  Patient presents with  . Allergic Reaction    HPI Teresa Mclaughlin is a 27 y.o. female with a medical history of endometriosis and ovarian cyst (s/p laprascopic removal today 03/26/18 @ [facility]) presented to the ED following an allergic reaction to Vicodin which she was prescribed for post-op pain. Patient states that she has never taken Vicodin today, but shortly after taking the medication she began feeling itchy all over. Denies angioedema, chest tightness, urticaria or rash, SOB, abdominal pain and vomiting.    Past Medical History:  Diagnosis Date  . Allergy   . Anemia   . Endometriosis   . Iron deficiency anemia 12/19/2011    Patient Active Problem List   Diagnosis Date Noted  . Anxiety state 12/08/2017  . History of iron deficiency anemia 12/19/2011    Past Surgical History:  Procedure Laterality Date  . ovarian cyst removed       OB History   None     Home Medications    Prior to Admission medications   Medication Sig Start Date End Date Taking? Authorizing Provider  hydrOXYzine (ATARAX/VISTARIL) 25 MG tablet Take 1 tablet (25 mg total) by mouth 3 (three) times daily. 12/08/17  Yes Weber, Sarah L, PA-C  ibuprofen (ADVIL,MOTRIN) 600 MG tablet Take 1 tablet (600 mg total) by mouth every 8 (eight) hours as needed. Patient taking differently: Take 600 mg by mouth daily as needed.  01/28/18  Yes Lisa Roca, MD  traMADol (ULTRAM) 50 MG tablet Take 1 tablet (50 mg total) by mouth every 6 (six) hours as needed for up to 5 days for moderate pain. 03/29/18 04/03/18  Mortis, Jonelle Sports, PA-C    Family History Family History  Problem Relation Age of Onset  . Diabetes Mother   . Hypertension Mother   . Diabetes Paternal Grandfather     Social History Social History   Tobacco Use  . Smoking status: Former Smoker    Types: Cigarettes    Last attempt to quit: 02/02/2018    Years since quitting: 0.1  . Smokeless tobacco: Never Used  . Tobacco comment: 2 cigarettes per day  Substance Use Topics  . Alcohol use: No    Alcohol/week: 0.0 oz  . Drug use: No     Allergies   Vicodin [hydrocodone-acetaminophen]; Flagyl [metronidazole]; and Penicillins   Review of Systems Review of Systems  Constitutional: Negative.   HENT: Negative.  Negative for sore throat, trouble swallowing and voice change.   Respiratory: Negative for cough, chest tightness and shortness of breath.   Cardiovascular: Negative for chest pain and palpitations.  Gastrointestinal: Negative for abdominal pain, nausea and vomiting.  Skin: Negative for color change and rash.  Neurological: Negative.      Physical Exam Updated Vital Signs BP (!) 131/94   Pulse 92   Temp 97.9 F (36.6 C) (Oral)   Resp 18   Ht 5\' 5"  (1.651 m)   Wt 51.7 kg (114 lb)   LMP 03/18/2018 (Exact Date)   SpO2 100%   BMI 18.97 kg/m   Physical Exam  Constitutional: She appears well-developed and well-nourished. She is cooperative.  Patient is scratching her back and trunk throughout the interview.  HENT:  Head: Normocephalic and atraumatic.  Mouth/Throat: Uvula is midline, oropharynx is clear and moist and mucous membranes are normal.  Neck: Trachea normal, normal  range of motion, full passive range of motion without pain and phonation normal. Neck supple.  Cardiovascular: Normal rate, regular rhythm, normal heart sounds and intact distal pulses.  Pulmonary/Chest: Effort normal and breath sounds normal.  Abdominal: Soft. Normal appearance and bowel sounds are normal. There is generalized tenderness.  Newly sutured laprascopic wounds on abdomen. Abdomen mildly tender to palpation.  Neurological: She is alert.  Skin: Skin is warm, dry and intact. Capillary refill takes less than 2 seconds. No rash noted. Rash is not urticarial.  Superficial scratch  marks noted on back and trunk. No rash or urticaria seen.  Nursing note and vitals reviewed.    ED Treatments / Results  Labs (all labs ordered are listed, but only abnormal results are displayed) Labs Reviewed - No data to display  EKG None  Radiology No results found.  Procedures Procedures (including critical care time)  Medications Ordered in ED Medications  ondansetron (ZOFRAN) tablet 4 mg (4 mg Oral Given 03/29/18 2028)  diphenhydrAMINE (BENADRYL) injection 25 mg (25 mg Intramuscular Given 03/29/18 2028)     Initial Impression / Assessment and Plan / ED Course  Triage vital signs and the nursing notes have been reviewed.  Pertinent labs & imaging results that were available during care of the patient were reviewed and considered in medical decision making (see chart for details).  Patient presented in no acute distress, but was severely scratching during the initial encounter. No s/s of anaphylaxis present. Patient given IM Benadryl 25mg  x1 in the ED and had decrease in itching and symptoms were relieved prior to discharge.  Final Clinical Impressions(s) / ED Diagnoses  1. Allergic Reaction. Vicodin listed as reaction in patient chart. Advised to take Benadryl 50mg  for future allergic reactions. 5-day supply of Tramadol prescribed for post-op pain given Vicodin allergy. Pharmacy called about new pain Rx. Patient encouraged to discard Vicodin Rx. Patient advised to schedule follow-up with surgeon regarding pain management. Educated on anaphylactic s/s.  Dispo: Home. After thorough clinical evaluation, this patient is determined to be medically stable and can be safely discharged with the previously mentioned treatment and/or outpatient follow-up/referral(s). At this time, there are no other apparent medical conditions that require further screening, evaluation or treatment.  Final diagnoses:  Allergic reaction, initial encounter    ED Discharge Orders        Ordered     traMADol (ULTRAM) 50 MG tablet  Every 6 hours PRN     03/29/18 2128        Junita Push 03/29/18 2149    Isla Pence, MD 03/29/18 2153

## 2018-03-29 NOTE — ED Notes (Signed)
Pt is  Alert and oriented x 4 and is verbally responsive. Pt reports took an Vicodin and pt reports that she has been having itching since pt denies pain at this time.

## 2018-03-29 NOTE — ED Notes (Signed)
No hives are noted and pt awy remains opened.

## 2018-03-29 NOTE — Anesthesia Procedure Notes (Signed)
Procedure Name: Intubation Date/Time: 03/29/2018 1:10 PM Performed by: Bufford Spikes, CRNA Pre-anesthesia Checklist: Patient identified, Emergency Drugs available, Suction available and Patient being monitored Patient Re-evaluated:Patient Re-evaluated prior to induction Oxygen Delivery Method: Circle system utilized Preoxygenation: Pre-oxygenation with 100% oxygen Induction Type: IV induction Ventilation: Mask ventilation without difficulty Laryngoscope Size: Miller and 2 Grade View: Grade I Tube type: Oral Tube size: 7.0 mm Number of attempts: 1 Airway Equipment and Method: Stylet Placement Confirmation: ETT inserted through vocal cords under direct vision,  positive ETCO2 and breath sounds checked- equal and bilateral Secured at: 21 cm Tube secured with: Tape Dental Injury: Teeth and Oropharynx as per pre-operative assessment

## 2018-03-29 NOTE — Anesthesia Preprocedure Evaluation (Signed)
Anesthesia Evaluation  Patient identified by MRN, date of birth, ID band Patient awake    Reviewed: Allergy & Precautions, NPO status , Patient's Chart, lab work & pertinent test results  Airway Mallampati: I       Dental no notable dental hx.    Pulmonary neg pulmonary ROS, former smoker,    Pulmonary exam normal        Cardiovascular negative cardio ROS Normal cardiovascular exam     Neuro/Psych negative neurological ROS  negative psych ROS   GI/Hepatic negative GI ROS, Neg liver ROS,   Endo/Other  negative endocrine ROS  Renal/GU negative Renal ROS  negative genitourinary   Musculoskeletal negative musculoskeletal ROS (+)   Abdominal   Peds  Hematology  (+) anemia ,   Anesthesia Other Findings   Reproductive/Obstetrics                             Lab Results  Component Value Date   WBC 6.0 03/23/2018   HGB 10.5 (L) 03/23/2018   HCT 34.9 (L) 03/23/2018   MCV 81.4 03/23/2018   PLT 278 03/23/2018    Anesthesia Physical Anesthesia Plan  ASA: II  Anesthesia Plan: General   Post-op Pain Management:    Induction: Intravenous  PONV Risk Score and Plan: Treatment may vary due to age or medical condition  Airway Management Planned: Oral ETT  Additional Equipment:   Intra-op Plan:   Post-operative Plan: Extubation in OR  Informed Consent: I have reviewed the patients History and Physical, chart, labs and discussed the procedure including the risks, benefits and alternatives for the proposed anesthesia with the patient or authorized representative who has indicated his/her understanding and acceptance.   Dental advisory given  Plan Discussed with: CRNA  Anesthesia Plan Comments:         Anesthesia Quick Evaluation

## 2018-03-29 NOTE — Brief Op Note (Signed)
03/29/2018  4:13 PM  PATIENT:  Niger J Human  27 y.o. female  PRE-OPERATIVE DIAGNOSIS:  Left Ovarian Cyst, LLQ pain  POST-OPERATIVE DIAGNOSIS:  Left Ovarian Cyst,PELVIC ENDOMETRIOSIS, LLQ pain, right ovarian lesion  PROCEDURE: DaVinci XI robotic left ovarian cystectomy, excision of  Pelvic endometriosis, right ovarian biopsy  SURGEON:  Surgeon(s) and Role:    Servando Salina, MD - Primary  PHYSICIAN ASSISTANT:   ASSISTANTS: Artelia Laroche, CNM   ANESTHESIA:   general Finding: large 8 cm left ovarian cyst, endometriotic implants left lateral wall and left ovarian fossa, medial to left uterosacral ligament, allan-master's window with endometriosis, right anterior cul de sac embedded/infiltrating endometrioisis, right ov with multiple small firm lesion ? Fibroma. Nl appendix, nl liver edge, nl tubes, nl uterus EBL:  25 mL   BLOOD ADMINISTERED:none  DRAINS: none   LOCAL MEDICATIONS USED:  MARCAINE     SPECIMEN:  Source of Specimen:  left ovarian cyst, right ov biopsy, endometriotic implants, left uterosacral lesion, right ant cul de sac lesion  DISPOSITION OF SPECIMEN:  PATHOLOGY  COUNTS:  YES  TOURNIQUET:  * No tourniquets in log *  DICTATION: .Other Dictation: Dictation Number [id number]  PLAN OF CARE: Discharge to home after PACU  PATIENT DISPOSITION:  PACU - hemodynamically stable.   Delay start of Pharmacological VTE agent (>24hrs) due to surgical blood loss or risk of bleeding: no

## 2018-03-29 NOTE — Discharge Instructions (Addendum)
Schedule a follow-up with surgeon as soon as possible to discuss alternatives to Vicodin for pain management.  Continue with Vistaril as prescribed tomorrow 03/30/18 for anxiety. Hold off on administering additional sedating medications today due to recent surgery, Vicodin and Benadryl received in the Ed.  Can give 50mg  Benadryl for subsequent allergic reactions that involve itching.

## 2018-03-30 ENCOUNTER — Encounter (HOSPITAL_COMMUNITY): Payer: Self-pay | Admitting: Obstetrics and Gynecology

## 2018-03-30 NOTE — Op Note (Signed)
NAME: Teresa Mclaughlin, Teresa Mclaughlin. MEDICAL RECORD ER:[id number] ACCOUNT [id number] DATE OF BIRTH:January 13, 1991 FACILITY: [facility] LOCATION: [facility] PHYSICIAN:Camber Ninh A. Nicky Kras, MD  OPERATIVE REPORT  DATE OF PROCEDURE:  03/29/2018  PREOPERATIVE DIAGNOSIS:  Left lower quadrant pain, persistent left ovarian cyst.  POSTOPERATIVE DIAGNOSIS:  Left ovarian cyst, left lower quadrant pain, pelvic endometriosis, right ovarian mass.  PROCEDURES:  Da Vinci robotic left ovarian cystectomy, excision of pelvic endometriosis, right ovarian biopsy.  ANESTHESIA:  General.  SURGEON:  Servando Salina, MD  ASSISTANT:  Gregary Cromer, CNM.  DESCRIPTION OF PROCEDURE:  Under adequate general anesthesia, the patient was placed in the dorsal lithotomy position.  She was sterilely prepped and draped in the usual fashion.  Examination under anesthesia had revealed a palpable 8 cm anterior mass noted in the midline.  The uterus was anteverted posterior to mass.  The patient was sterilely prepped and draped.  An indwelling Foley catheter was sterilely placed.  Bivalve speculum was placed in the vagina.  Single tooth tenaculum was placed on the anterior lip of the cervix.  An acorn cannula was introduced into the cervical os and attached to the tenaculum for manipulation of the uterus.  The bivalve speculum was then removed.  Attention was then turned to the abdomen.  The patient is a very thin female.  Supraumbilical incision was made after 0.25% Marcaine was injected.   Peripheral placement of the Veress needle was done.  Opening pressure of 4 was noted.  Two liters of CO2 was insufflated.  The Veress needle had been tested with sterile water.  The Veress needle was removed.  A 10 mm disposable trocar with sleeve, robotic camera port was inserted.  The robotic camera was then inserted and confirmed entry into the abdomen without incident.  A large cystic mass was then noted arising from  the left side of the patient.  At that  point, she was placed in Trendelenburg position.  The additional robotic port sites were placed as far lateral as possible in a linear fashion and a 5 mm AirSeal site was placed in the left upper quadrant.  Once these were placed under direct visualization, the robotic Xi was then brought to the patient's bedside and docked.  Arm #2 was used for the camera port and  arm #1 and arm #3  laterally placed  With arm # 3, a monopolar scissors was placed and arm #1, a PK dissector was placed.  Panoramic inspection was done.  The patient at that point was in full Trendelenburg position.   I then went to the surgical console.  At the surgical console, with the probe being used, the pelvis was inspected.  There was a very large, at least 8 cm, clear appearing cystic mass arising from the left ovary encasing the entire left ovary and then on the right was an ovary that had a small firm nodules, what appears to be, possible early fibroma on the medial portion, but otherwise normal.  Normal tubes were noted bilaterally except for the left tube was overextended.  The uterus was normal.  There were multiple endometriotic implants noted.  When the left ovary was lifted up, there were several endometriotic implants on that left lateral wall, particularly over the ovarian fossa.  There was also endometriosis medial to the left uterosacral ligament with an Allen-Masters window in place.  On the right, in the right anterior cul-de-sac laterally, was a puckering of endometriosis as well.  The procedure was started by stabilizing the left ovary.  Cautery  was used to carefully make a linear incision  and the serosa of the ovary was lifted up to separate it from the ovarian cyst.  In doing so, incidental rupture of the cyst occurred.  Clear copious amount of fluid arose from it.  With continued blunt and minimally sharp dissection, the cyst wall was  removed.  As it came closer to the base end of the cyst, it was then noted that the  internal aspect had a collection of excrescences at which time they were noted to be a lot firmer and not able to be enucleated.  At that point, sharp dissection was then used to encompass the area with total removal of the cyst and the aspects of it.  Small strings of the cyst wall were also additionally removed.  Cauterization was used for small bleeders.  The area of the cyst wall was actually closer to where the fallopian tube was attached to the ovary and leaving a small opening gap for where the excision had taken place.  Once this was done, attention was then turned towards the endometriotic implants.  One of them was overlying a ureter, so carefully raised  that endometriotic implants off of that area and excision of all endometriotic implants were then performed including the medial aspect of the uterosacral.  That particular one was very hard and firm and deep involving bringing an opening further of the  Allen-Masters window and excising that particular lesion and sending it separately.  The right anterior cul-de-sac of wall mass was also resected where underlying fat was noted, but it was also very firm and that was removed and sent separately as well.   The decision was then made to take a piece of the protruding aspect off of the right ovary as a biopsy and that was done as well.  Once all areas had been inspected and felt to have been either excised entirely and reinspection of the left ovarian defect, the bleeding near the hilum was carefully cauterized.  The pelvis was irrigated throughout.  Good hemostasis was subsequently achieved.  The appendix was noted to be normal.  Normal liver edge was noted.  The procedure was felt to be complete.   At that point, the instruments were removed.  The robot was undocked.  I then went back to the patient's bedside and placed Arista potato starch in the area of the resections.  The robotic port sites were then removed.  The incisions were then closed with  4-0 Vicryl subcuticular closure and the instruments in the vagina was removed.  The Foley was also removed.    SPECIMENS:   Left ovarian cyst wall, endometriotic implants, right ovarian biopsy site, uterosacral site tissue as well as the anterior cul-de-sac mass that was a collection of tissue that was sent separately.  ESTIMATED BLOOD Teresa Mclaughlin:  25 mL.  COMPLICATIONS:  None.    DISPOSITION:  The patient tolerated the procedure well and was transferred to recovery room in stable condition.  AN/NUANCE  D:03/30/2018 T:03/30/2018 JOB:[id number]

## 2018-04-11 ENCOUNTER — Other Ambulatory Visit: Payer: Self-pay | Admitting: Obstetrics and Gynecology

## 2018-05-15 ENCOUNTER — Encounter: Payer: 59 | Admitting: Physician Assistant

## 2018-05-17 ENCOUNTER — Ambulatory Visit (INDEPENDENT_AMBULATORY_CARE_PROVIDER_SITE_OTHER): Payer: 59 | Admitting: Physician Assistant

## 2018-05-17 ENCOUNTER — Encounter: Payer: Self-pay | Admitting: Physician Assistant

## 2018-05-17 ENCOUNTER — Other Ambulatory Visit: Payer: Self-pay

## 2018-05-17 VITALS — BP 102/70 | HR 78 | Temp 98.5°F | Resp 18 | Ht 65.0 in | Wt 116.2 lb

## 2018-05-17 DIAGNOSIS — Z13228 Encounter for screening for other metabolic disorders: Secondary | ICD-10-CM

## 2018-05-17 DIAGNOSIS — Z1329 Encounter for screening for other suspected endocrine disorder: Secondary | ICD-10-CM

## 2018-05-17 DIAGNOSIS — Z0000 Encounter for general adult medical examination without abnormal findings: Secondary | ICD-10-CM | POA: Diagnosis not present

## 2018-05-17 DIAGNOSIS — D649 Anemia, unspecified: Secondary | ICD-10-CM

## 2018-05-17 DIAGNOSIS — Z13220 Encounter for screening for lipoid disorders: Secondary | ICD-10-CM

## 2018-05-17 NOTE — Patient Instructions (Addendum)
Health Maintenance, Female Adopting a healthy lifestyle and getting preventive care can go a long way to promote health and wellness. Talk with your health care provider about what schedule of regular examinations is right for you. This is a good chance for you to check in with your provider about disease prevention and staying healthy. In between checkups, there are plenty of things you can do on your own. Experts have done a lot of research about which lifestyle changes and preventive measures are most likely to keep you healthy. Ask your health care provider for more information. Weight and diet Eat a healthy diet  Be sure to include plenty of vegetables, fruits, low-fat dairy products, and lean protein.  Do not eat a lot of foods high in solid fats, added sugars, or salt.  Get regular exercise. This is one of the most important things you can do for your health. ? Most adults should exercise for at least 150 minutes each week. The exercise should increase your heart rate and make you sweat (moderate-intensity exercise). ? Most adults should also do strengthening exercises at least twice a week. This is in addition to the moderate-intensity exercise.  Maintain a healthy weight  Body mass index (BMI) is a measurement that can be used to identify possible weight problems. It estimates body fat based on height and weight. Your health care provider can help determine your BMI and help you achieve or maintain a healthy weight.  For females 46 years of age and older: ? A BMI below 18.5 is considered underweight. ? A BMI of 18.5 to 24.9 is normal. ? A BMI of 25 to 29.9 is considered overweight. ? A BMI of 30 and above is considered obese.  Watch levels of cholesterol and blood lipids  You should start having your blood tested for lipids and cholesterol at 25 years of age, then have this test every 5 years.  You may need to have your cholesterol levels checked more often if: ? Your lipid  or cholesterol levels are high. ? You are older than 25 years of age. ? You are at high risk for heart disease.  Cancer screening Lung Cancer  Lung cancer screening is recommended for adults 79-62 years old who are at high risk for lung cancer because of a history of smoking.  A yearly low-dose CT scan of the lungs is recommended for people who: ? Currently smoke. ? Have quit within the past 15 years. ? Have at least a 30-pack-year history of smoking. A pack year is smoking an average of one pack of cigarettes a day for 1 year.  Yearly screening should continue until it has been 15 years since you quit.  Yearly screening should stop if you develop a health problem that would prevent you from having lung cancer treatment.  Breast Cancer  Practice breast self-awareness. This means understanding how your breasts normally appear and feel.  It also means doing regular breast self-exams. Let your health care provider know about any changes, no matter how small.  If you are in your 20s or 30s, you should have a clinical breast exam (CBE) by a health care provider every 1-3 years as part of a regular health exam.  If you are 59 or older, have a CBE every year. Also consider having a breast X-ray (mammogram) every year.  If you have a family history of breast cancer, talk to your health care provider about genetic screening.  If you are at high  risk for breast cancer, talk to your health care provider about having an MRI and a mammogram every year.  Breast cancer gene (BRCA) assessment is recommended for women who have family members with BRCA-related cancers. BRCA-related cancers include: ? Breast. ? Ovarian. ? Tubal. ? Peritoneal cancers.  Results of the assessment will determine the need for genetic counseling and BRCA1 and BRCA2 testing.  Cervical Cancer Your health care provider may recommend that you be screened regularly for cancer of the pelvic organs (ovaries, uterus, and  vagina). This screening involves a pelvic examination, including checking for microscopic changes to the surface of your cervix (Pap test). You may be encouraged to have this screening done every 3 years, beginning at age 21.  For women ages 30-65, health care providers may recommend pelvic exams and Pap testing every 3 years, or they may recommend the Pap and pelvic exam, combined with testing for human papilloma virus (HPV), every 5 years. Some types of HPV increase your risk of cervical cancer. Testing for HPV may also be done on women of any age with unclear Pap test results.  Other health care providers may not recommend any screening for nonpregnant women who are considered low risk for pelvic cancer and who do not have symptoms. Ask your health care provider if a screening pelvic exam is right for you.  If you have had past treatment for cervical cancer or a condition that could lead to cancer, you need Pap tests and screening for cancer for at least 20 years after your treatment. If Pap tests have been discontinued, your risk factors (such as having a new sexual partner) need to be reassessed to determine if screening should resume. Some women have medical problems that increase the chance of getting cervical cancer. In these cases, your health care provider may recommend more frequent screening and Pap tests.  Colorectal Cancer  This type of cancer can be detected and often prevented.  Routine colorectal cancer screening usually begins at 25 years of age and continues through 25 years of age.  Your health care provider may recommend screening at an earlier age if you have risk factors for colon cancer.  Your health care provider may also recommend using home test kits to check for hidden blood in the stool.  A small camera at the end of a tube can be used to examine your colon directly (sigmoidoscopy or colonoscopy). This is done to check for the earliest forms of colorectal  cancer.  Routine screening usually begins at age 50.  Direct examination of the colon should be repeated every 5-10 years through 25 years of age. However, you may need to be screened more often if early forms of precancerous polyps or small growths are found.  Skin Cancer  Check your skin from head to toe regularly.  Tell your health care provider about any new moles or changes in moles, especially if there is a change in a mole's shape or color.  Also tell your health care provider if you have a mole that is larger than the size of a pencil eraser.  Always use sunscreen. Apply sunscreen liberally and repeatedly throughout the day.  Protect yourself by wearing long sleeves, pants, a wide-brimmed hat, and sunglasses whenever you are outside.  Heart disease, diabetes, and high blood pressure  High blood pressure causes heart disease and increases the risk of stroke. High blood pressure is more likely to develop in: ? People who have blood pressure in the high end   of the normal range (130-139/85-89 mm Hg). ? People who are overweight or obese. ? People who are African American.  If you are 18-39 years of age, have your blood pressure checked every 3-5 years. If you are 40 years of age or older, have your blood pressure checked every year. You should have your blood pressure measured twice-once when you are at a hospital or clinic, and once when you are not at a hospital or clinic. Record the average of the two measurements. To check your blood pressure when you are not at a hospital or clinic, you can use: ? An automated blood pressure machine at a pharmacy. ? A home blood pressure monitor.  If you are between 55 years and 79 years old, ask your health care provider if you should take aspirin to prevent strokes.  Have regular diabetes screenings. This involves taking a blood sample to check your fasting blood sugar level. ? If you are at a normal weight and have a low risk for diabetes,  have this test once every three years after 25 years of age. ? If you are overweight and have a high risk for diabetes, consider being tested at a younger age or more often. Preventing infection Hepatitis B  If you have a higher risk for hepatitis B, you should be screened for this virus. You are considered at high risk for hepatitis B if: ? You were born in a country where hepatitis B is common. Ask your health care provider which countries are considered high risk. ? Your parents were born in a high-risk country, and you have not been immunized against hepatitis B (hepatitis B vaccine). ? You have HIV or AIDS. ? You use needles to inject street drugs. ? You live with someone who has hepatitis B. ? You have had sex with someone who has hepatitis B. ? You get hemodialysis treatment. ? You take certain medicines for conditions, including cancer, organ transplantation, and autoimmune conditions.  Hepatitis C  Blood testing is recommended for: ? Everyone born from 1945 through 1965. ? Anyone with known risk factors for hepatitis C.  Sexually transmitted infections (STIs)  You should be screened for sexually transmitted infections (STIs) including gonorrhea and chlamydia if: ? You are sexually active and are younger than 24 years of age. ? You are older than 24 years of age and your health care provider tells you that you are at risk for this type of infection. ? Your sexual activity has changed since you were last screened and you are at an increased risk for chlamydia or gonorrhea. Ask your health care provider if you are at risk.  If you do not have HIV, but are at risk, it may be recommended that you take a prescription medicine daily to prevent HIV infection. This is called pre-exposure prophylaxis (PrEP). You are considered at risk if: ? You are sexually active and do not regularly use condoms or know the HIV status of your partner(s). ? You take drugs by injection. ? You are  sexually active with a partner who has HIV.  Talk with your health care provider about whether you are at high risk of being infected with HIV. If you choose to begin PrEP, you should first be tested for HIV. You should then be tested every 3 months for as long as you are taking PrEP. Pregnancy  If you are premenopausal and you may become pregnant, ask your health care provider about preconception counseling.  If you may   become pregnant, take 400 to 800 micrograms (mcg) of folic acid every day.  If you want to prevent pregnancy, talk to your health care provider about birth control (contraception). Osteoporosis and menopause  Osteoporosis is a disease in which the bones lose minerals and strength with aging. This can result in serious bone fractures. Your risk for osteoporosis can be identified using a bone density scan.  If you are 43 years of age or older, or if you are at risk for osteoporosis and fractures, ask your health care provider if you should be screened.  Ask your health care provider whether you should take a calcium or vitamin D supplement to lower your risk for osteoporosis.  Menopause may have certain physical symptoms and risks.  Hormone replacement therapy may reduce some of these symptoms and risks. Talk to your health care provider about whether hormone replacement therapy is right for you. Follow these instructions at home:  Schedule regular health, dental, and eye exams.  Stay current with your immunizations.  Do not use any tobacco products including cigarettes, chewing tobacco, or electronic cigarettes.  If you are pregnant, do not drink alcohol.  If you are breastfeeding, limit how much and how often you drink alcohol.  Limit alcohol intake to no more than 1 drink per day for nonpregnant women. One drink equals 12 ounces of beer, 5 ounces of wine, or 1 ounces of hard liquor.  Do not use street drugs.  Do not share needles.  Ask your health care  provider for help if you need support or information about quitting drugs.  Tell your health care provider if you often feel depressed.  Tell your health care provider if you have ever been abused or do not feel safe at home. This information is not intended to replace advice given to you by your health care provider. Make sure you discuss any questions you have with your health care provider. Document Released: 05/09/2011 Document Revised: 03/31/2016 Document Reviewed: 07/28/2015 Elsevier Interactive Patient Education  2018 [employer].   IF you received an x-ray today, you will receive an invoice from [facility]. Please contact [facility] at [phone] with questions or concerns regarding your invoice.   IF you received labwork today, you will receive an invoice from [facility]. Please contact [facility] at [phone] with questions or concerns regarding your invoice.   Our billing staff will not be able to assist you with questions regarding bills from these companies.  You will be contacted with the lab results as soon as they are available. The fastest way to get your results is to activate your My Chart account. Instructions are located on the last page of this paperwork. If you have not heard from [place] regarding the results in 2 weeks, please contact this office.

## 2018-05-17 NOTE — Progress Notes (Signed)
Teresa Mclaughlin  MRN: [id number] DOB: 07-13-1991  PCP: Mancel Bale, PA-C   Chief Complaint  Patient presents with  . Annual Exam    Subjective:  Pt presents to clinic for a CPE.  Atarax 102 times a day - works well still  Last dental exam: every 6 months Last vision exam: 02/2018 Last pap: 06/2017 - normal  Vaccinations - UTD   Typical meals for patient: 2 meals, snacks - home cooked meals Typical beverage choices: water or jouce Exercises: none Sleeps: 7 hrs per night and sleeping well   Patient Active Problem List   Diagnosis Date Noted  . Anxiety state 12/08/2017  . History of iron deficiency anemia 12/19/2011    Patient Care Team: Mittie Bodo as PCP - General (Physician Assistant) Servando Salina, MD as Consulting Physician (Obstetrics and Gynecology)  Review of Systems  Constitutional: Negative for chills and fever.  Gastrointestinal: Negative for nausea.  Skin: Positive for wound. Negative for rash.     Current Outpatient Medications on File Prior to Visit  Medication Sig Dispense Refill  . hydrOXYzine (ATARAX/VISTARIL) 25 MG tablet Take 1 tablet (25 mg total) by mouth 3 (three) times daily. 60 tablet 5  . ibuprofen (ADVIL,MOTRIN) 600 MG tablet Take 1 tablet (600 mg total) by mouth every 8 (eight) hours as needed. (Patient taking differently: Take 600 mg by mouth daily as needed. ) 20 tablet 0  . SPRINTEC 28 0.25-35 MG-MCG tablet TAKE 1 TABLET BY MOUTH EVERY DAY CONTINUOUS ACTIVE PILLS ONLY, NO PLACEBOS  0   No current facility-administered medications on file prior to visit.     Allergies  Allergen Reactions  . Vicodin [Hydrocodone-Acetaminophen] Itching  . Flagyl [Metronidazole] Other (See Comments)    Numbness and dizziness   . Penicillins Other (See Comments)    Childhood allergy, unknown reaction Has patient had a PCN reaction causing immediate rash, facial/tongue/throat swelling, SOB or lightheadedness with hypotension:  Unknown Has patient had a PCN reaction causing severe rash involving mucus membranes or skin necrosis: Unknown Has patient had a PCN reaction that required hospitalization: Unknown Has patient had a PCN reaction occurring within the last 10 years: Unknown If all of the above answers are "NO", then may proceed with Cephalosporin use.       Social History   Socioeconomic History  . Marital status: Single    Spouse name: Not on file  . Number of children: Not on file  . Years of education: Not on file  . Highest education level: Not on file  Occupational History  . Not on file  Social Needs  . Financial resource strain: Not on file  . Food insecurity:    Worry: Not on file    Inability: Not on file  . Transportation needs:    Medical: Not on file    Non-medical: Not on file  Tobacco Use  . Smoking status: Former Smoker    Types: Cigarettes    Last attempt to quit: 02/02/2018    Years since quitting: 0.2  . Smokeless tobacco: Never Used  . Tobacco comment: 2 cigarettes per day  Substance and Sexual Activity  . Alcohol use: No    Alcohol/week: 0.0 oz  . Drug use: No  . Sexual activity: Not Currently  Lifestyle  . Physical activity:    Days per week: Not on file    Minutes per session: Not on file  . Stress: Not on file  Relationships  . Social connections:  Talks on phone: Not on file    Gets together: Not on file    Attends religious service: Not on file    Active member of club or organization: Not on file    Attends meetings of clubs or organizations: Not on file    Relationship status: Not on file  Other Topics Concern  . 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 ovarian cyst removed    . ROBOTIC ASSISTED LAPAROSCOPIC OVARIAN CYSTECTOMY Left 03/29/2018   Procedure: XI ROBOTIC ASSISTED LAPAROSCOPIC OVARIAN CYSTECTOMY, EXCISION OF PELVIC ENDOMETRIOSIS;  Surgeon: Servando Salina, MD;  Location: [facility];  Service: Gynecology;  Laterality: Left;  Requests 2 hrs.    Family History  Problem Relation Age of Onset  . Diabetes Mother   . Hypertension Mother   . Diabetes Paternal Grandfather   . High Cholesterol Father   . Post-traumatic stress disorder Father        from [employer] service     Objective:  BP 102/70   Pulse 78   Temp 98.5 F (36.9 C) (Oral)   Resp 18   Ht '5\' 5"'$  (1.651 m)   Wt 116 lb 3.2 oz (52.7 kg)   LMP 04/18/2018   SpO2 100%   BMI 19.34 kg/m   Physical Exam  Constitutional: She is oriented to person, place, and time. She appears well-developed and well-nourished.  HENT:  Head: Normocephalic and atraumatic.  Right Ear: Hearing, tympanic membrane, external ear and ear canal normal.  Left Ear: Hearing, tympanic membrane, external ear and ear canal normal.  Nose: Nose normal.  Mouth/Throat: Uvula is midline, oropharynx is clear and moist and mucous membranes are normal.  Eyes: Pupils are equal, round, and reactive to light. Conjunctivae, EOM and lids are normal. Right eye exhibits no discharge. Left eye exhibits no discharge.  Neck: Trachea normal and normal range of motion. Neck supple. No thyroid mass and no thyromegaly present.  Cardiovascular: Normal rate, regular rhythm and normal heart sounds.  No murmur heard. Pulmonary/Chest: Effort normal and breath sounds normal. She has no wheezes.  Abdominal: Soft. Normal appearance and bowel sounds are normal. There is no tenderness.  Tail of SQ suture removed without difficulty from RUQ incision - well healed incisions  Musculoskeletal: Normal range of motion.  Lymphadenopathy:       Head (right side): No tonsillar,  no preauricular, no posterior auricular and no occipital adenopathy present.       Head (left side): No tonsillar, no preauricular, no posterior auricular and no occipital adenopathy present.    She has no cervical adenopathy.       Right: No supraclavicular adenopathy present.       Left: No supraclavicular adenopathy present.  Neurological: She is alert and oriented to person, place, and time. She has normal strength and normal reflexes.  Skin: Skin is warm, dry and intact.  Psychiatric: She has a normal mood and affect. Her speech is normal and behavior  is normal. Judgment and thought content normal.  Vitals reviewed.   Wt Readings from Last 3 Encounters:  05/17/18 116 lb 3.2 oz (52.7 kg)  03/29/18 114 lb (51.7 kg)  03/29/18 113 lb (51.3 kg)     Visual Acuity Screening   Right eye Left eye Both eyes  Without correction:     With correction: '20/15 20/15 20/15 '$    Assessment and Plan :  Annual physical exam  Screening for metabolic disorder - Plan: CMP14+EGFR  Anemia, unspecified type - Plan: CBC with Differential/Platelet  Screening, lipid - Plan: Lipid panel  Screening for thyroid disorder - Plan: TSH  Windell Hummingbird PA-C  [facility] 05/17/2018 2:05 PM  Please note: Portions of this report may have been transcribed using dragon voice recognition software. Every effort was made to ensure accuracy; however, inadvertent computerized transcription errors may be present.

## 2018-05-18 LAB — CBC WITH DIFFERENTIAL/PLATELET
Basophils Absolute: 0 10*3/uL (ref 0.0–0.2)
Basos: 1 %
EOS (ABSOLUTE): 0.2 10*3/uL (ref 0.0–0.4)
EOS: 2 %
HEMATOCRIT: 34.2 % (ref 34.0–46.6)
HEMOGLOBIN: 10.3 g/dL — AB (ref 11.1–15.9)
IMMATURE GRANULOCYTES: 0 %
Immature Grans (Abs): 0 10*3/uL (ref 0.0–0.1)
LYMPHS ABS: 1.8 10*3/uL (ref 0.7–3.1)
LYMPHS: 24 %
MCH: 23.8 pg — ABNORMAL LOW (ref 26.6–33.0)
MCHC: 30.1 g/dL — AB (ref 31.5–35.7)
MCV: 79 fL (ref 79–97)
MONOCYTES: 6 %
Monocytes Absolute: 0.5 10*3/uL (ref 0.1–0.9)
NEUTROS PCT: 67 %
Neutrophils Absolute: 4.9 10*3/uL (ref 1.4–7.0)
Platelets: 342 10*3/uL (ref 150–450)
RBC: 4.32 x10E6/uL (ref 3.77–5.28)
RDW: 14.5 % (ref 12.3–15.4)
WBC: 7.4 10*3/uL (ref 3.4–10.8)

## 2018-05-18 LAB — CMP14+EGFR
ALK PHOS: 49 IU/L (ref 39–117)
ALT: 12 IU/L (ref 0–32)
AST: 13 IU/L (ref 0–40)
Albumin/Globulin Ratio: 1.7 (ref 1.2–2.2)
Albumin: 4.6 g/dL (ref 3.5–5.5)
BUN/Creatinine Ratio: 14 (ref 9–23)
BUN: 10 mg/dL (ref 6–20)
CALCIUM: 9.5 mg/dL (ref 8.7–10.2)
CHLORIDE: 106 mmol/L (ref 96–106)
CO2: 20 mmol/L (ref 20–29)
CREATININE: 0.7 mg/dL (ref 0.57–1.00)
GFR calc Af Amer: 139 mL/min/{1.73_m2} (ref >59)
GFR calc non Af Amer: 121 mL/min/{1.73_m2} (ref >59)
GLOBULIN, TOTAL: 2.7 g/dL (ref 1.5–4.5)
Glucose: 87 mg/dL (ref 65–99)
Potassium: 4.8 mmol/L (ref 3.5–5.2)
Sodium: 141 mmol/L (ref 134–144)
TOTAL PROTEIN: 7.3 g/dL (ref 6.0–8.5)

## 2018-05-18 LAB — LIPID PANEL
Chol/HDL Ratio: 2.3 ratio (ref 0.0–4.4)
Cholesterol, Total: 140 mg/dL (ref 100–199)
HDL: 60 mg/dL (ref >39)
LDL CALC: 65 mg/dL (ref 0–99)
TRIGLYCERIDES: 74 mg/dL (ref 0–149)
VLDL Cholesterol Cal: 15 mg/dL (ref 5–40)

## 2018-05-18 LAB — TSH: TSH: 0.9 u[IU]/mL (ref 0.450–4.500)

## 2018-05-22 DIAGNOSIS — D649 Anemia, unspecified: Secondary | ICD-10-CM | POA: Diagnosis not present

## 2018-05-22 NOTE — Addendum Note (Signed)
Addended by: Benson Setting L on: 05/22/2018 04:55 PM   Modules accepted: Orders

## 2018-05-23 LAB — IRON,TIBC AND FERRITIN PANEL
Ferritin: 7 ng/mL — ABNORMAL LOW (ref 15–150)
IRON SATURATION: 9 % — AB (ref 15–55)
IRON: 44 ug/dL (ref 27–159)
TIBC: 464 ug/dL — AB (ref 250–450)
UIBC: 420 ug/dL (ref 131–425)

## 2018-05-24 ENCOUNTER — Encounter: Payer: Self-pay | Admitting: Physician Assistant

## 2018-05-25 MED ORDER — FERROUS GLUCONATE 324 (38 FE) MG PO TABS: 324.0000 mg | ORAL_TABLET | Freq: Two times a day (BID) | ORAL | 3 refills | Status: AC

## 2018-06-24 ENCOUNTER — Other Ambulatory Visit: Payer: Self-pay | Admitting: Physician Assistant

## 2018-06-24 DIAGNOSIS — F411 Generalized anxiety disorder: Secondary | ICD-10-CM

## 2018-07-05 DIAGNOSIS — N809 Endometriosis, unspecified: Secondary | ICD-10-CM | POA: Diagnosis not present

## 2018-11-08 ENCOUNTER — Ambulatory Visit (INDEPENDENT_AMBULATORY_CARE_PROVIDER_SITE_OTHER): Payer: Self-pay | Admitting: Nurse Practitioner

## 2018-11-08 VITALS — BP 118/70 | HR 67 | Resp 20 | Wt 119.8 lb

## 2018-11-08 DIAGNOSIS — N390 Urinary tract infection, site not specified: Secondary | ICD-10-CM

## 2018-11-08 DIAGNOSIS — R350 Frequency of micturition: Secondary | ICD-10-CM

## 2018-11-08 MED ORDER — SULFAMETHOXAZOLE-TRIMETHOPRIM 800-160 MG PO TABS: 1.0000 | ORAL_TABLET | Freq: Two times a day (BID) | ORAL | 0 refills | Status: AC

## 2018-11-08 NOTE — Progress Notes (Signed)
Subjective:    Teresa Mclaughlin is a 28 y.o. female who complains of abnormal smelling urine and frequency for 3 weeks.  Patient also complains of nocturia. Patient denies back pain, fever, stomach ache and vaginal discharge.  Patient does not have a history of recurrent UTI.  Patient does not have a history of pyelonephritis.  The patient informs that she does have a history of endometriosis.  Patient is on birth control and does not have regular monthly periods.  The following portions of the patient's history were reviewed and updated as appropriate: allergies, current medications and past medical history.  Review of Systems Constitutional: negative Eyes: negative Ears, nose, mouth, throat, and face: negative Respiratory: negative Cardiovascular: negative Gastrointestinal: negative Genitourinary:positive for frequency, hesitancy, nocturia and malodorous urine, urgency, negative for abnormal menstrual periods, genital lesions and vaginal discharge, dysuria and urinary incontinence Neurological: negative    Objective:    There were no vitals taken for this visit. General: alert, cooperative and no distress  Abdomen: soft, non-tender, without masses or organomegaly in the entire abdomen  Back: back muscles are full ROM, CVA tenderness absent  GU: defer exam   Laboratory:  Urine dipstick shows sp gravity 1.010, negative for glucose, 3+ for hemoglobin, negative for ketones, negative for leukocyte esterase, negative for nitrites, trace for protein and 0.2 for urobilinogen.   Micro exam: not done.    Assessment:    Acute cystitis    Plan:   Exam findings, diagnosis etiology and medication use and indications reviewed with patient. Follow- Up and discharge instructions provided. No emergent/urgent issues found on exam.  Based on the patient's clinical presentation and symptomology, decided to go ahead and treat with Bactrim for 3 days, the shortest course of antibiotics for the symptoms..   Patient's urinalysis was negative for leukocytes, however patient did have a large amount of blood in her urine.  Patient did not show concern for pyelonephritis or kidney stones.  Discussed with patient the results of her urinalysis and instructed her to follow-up with her primary care physician for a urine culture as soon as possible.  Patient education was provided. Patient verbalized understanding of information provided and agrees with plan of care (POC), all questions answered. The patient is advised to call or return to clinic if condition does not see an improvement in symptoms, or to seek the care of the closest emergency department if condition worsens with the above plan.   1. Frequent urination  - POCT urinalysis dipstick  2. Acute urinary tract infection  - sulfamethoxazole-trimethoprim (BACTRIM DS) 800-160 MG tablet; Take 1 tablet by mouth 2 (two) times daily for 3 days.  Dispense: 6 tablet; Refill: 0 -Take medication as prescribed. -Ibuprofen or Tylenol for pain, fever, or general discomfort. -Develop a toileting schedule to void at least every 2 hours. -Avoid caffeine, to include tea, soda, and coffee. -Void approximately 15 to 20 minutes after sexual intercourse. -Increase fluids. -Follow-up with your PCP if symptoms do not improve.  At that time you may likely need a urine culture. -Follow-up in our office as needed.

## 2018-11-08 NOTE — Patient Instructions (Signed)
Urinary Tract Infection, Adult  -Take medication as prescribed. -Ibuprofen or Tylenol for pain, fever, or general discomfort. -Develop a toileting schedule to void at least every 2 hours. -Avoid caffeine, to include tea, soda, and coffee. -Void approximately 15 to 20 minutes after sexual intercourse. -Increase fluids. -Follow-up with your PCP if symptoms do not improve.  At that time you may likely need a urine culture. -Follow-up in our office as needed.  A urinary tract infection (UTI) is an infection of any part of the urinary tract. The urinary tract includes the kidneys, ureters, bladder, and urethra. These organs make, store, and get rid of urine in the body. Your health care provider may use other names to describe the infection. An upper UTI affects the ureters and kidneys (pyelonephritis). A lower UTI affects the bladder (cystitis) and urethra (urethritis). What are the causes? Most urinary tract infections are caused by bacteria in your genital area, around the entrance to your urinary tract (urethra). These bacteria grow and cause inflammation of your urinary tract. What increases the risk? You are more likely to develop this condition if:  You have a urinary catheter that stays in place (indwelling).  You are not able to control when you urinate or have a bowel movement (you have incontinence).  You are female and you: ? Use a spermicide or diaphragm for birth control. ? Have low estrogen levels. ? Are pregnant.  You have certain genes that increase your risk (genetics).  You are sexually active.  You take antibiotic medicines.  You have a condition that causes your flow of urine to slow down, such as: ? An enlarged prostate, if you are female. ? Blockage in your urethra (stricture). ? A kidney stone. ? A nerve condition that affects your bladder control (neurogenic bladder). ? Not getting enough to drink, or not urinating often.  You have certain medical conditions,  such as: ? Diabetes. ? A weak disease-fighting system (immunesystem). ? Sickle cell disease. ? Gout. ? Spinal cord injury. What are the signs or symptoms? Symptoms of this condition include:  Needing to urinate right away (urgently).  Frequent urination or passing small amounts of urine frequently.  Pain or burning with urination.  Blood in the urine.  Urine that smells bad or unusual.  Trouble urinating.  Cloudy urine.  Vaginal discharge, if you are female.  Pain in the abdomen or the lower back. You may also have:  Vomiting or a decreased appetite.  Confusion.  Irritability or tiredness.  A fever.  Diarrhea. The first symptom in older adults may be confusion. In some cases, they may not have any symptoms until the infection has worsened. How is this diagnosed? This condition is diagnosed based on your medical history and a physical exam. You may also have other tests, including:  Urine tests.  Blood tests.  Tests for sexually transmitted infections (STIs). If you have had more than one UTI, a cystoscopy or imaging studies may be done to determine the cause of the infections. How is this treated? Treatment for this condition includes:  Antibiotic medicine.  Over-the-counter medicines to treat discomfort.  Drinking enough water to stay hydrated. If you have frequent infections or have other conditions such as a kidney stone, you may need to see a health care provider who specializes in the urinary tract (urologist). In rare cases, urinary tract infections can cause sepsis. Sepsis is a life-threatening condition that occurs when the body responds to an infection. Sepsis is treated in the hospital  with IV antibiotics, fluids, and other medicines. Follow these instructions at home:  Medicines  Take over-the-counter and prescription medicines only as told by your health care provider.  If you were prescribed an antibiotic medicine, take it as told by your  health care provider. Do not stop using the antibiotic even if you start to feel better. General instructions  Make sure you: ? Empty your bladder often and completely. Do not hold urine for long periods of time. ? Empty your bladder after sex. ? Wipe from front to back after a bowel movement if you are female. Use each tissue one time when you wipe.  Drink enough fluid to keep your urine pale yellow.  Keep all follow-up visits as told by your health care provider. This is important. Contact a health care provider if:  Your symptoms do not get better after 1-2 days.  Your symptoms go away and then return. Get help right away if you have:  Severe pain in your back or your lower abdomen.  A fever.  Nausea or vomiting. Summary  A urinary tract infection (UTI) is an infection of any part of the urinary tract, which includes the kidneys, ureters, bladder, and urethra.  Most urinary tract infections are caused by bacteria in your genital area, around the entrance to your urinary tract (urethra).  Treatment for this condition often includes antibiotic medicines.  If you were prescribed an antibiotic medicine, take it as told by your health care provider. Do not stop using the antibiotic even if you start to feel better.  Keep all follow-up visits as told by your health care provider. This is important. This information is not intended to replace advice given to you by your health care provider. Make sure you discuss any questions you have with your health care provider. Document Released: 08/03/2005 Document Revised: 05/03/2018 Document Reviewed: 05/03/2018 Elsevier Interactive Patient Education  2019 [employer].

## 2018-11-21 DIAGNOSIS — Z118 Encounter for screening for other infectious and parasitic diseases: Secondary | ICD-10-CM | POA: Diagnosis not present

## 2018-11-21 DIAGNOSIS — R102 Pelvic and perineal pain: Secondary | ICD-10-CM | POA: Diagnosis not present

## 2018-11-21 DIAGNOSIS — N898 Other specified noninflammatory disorders of vagina: Secondary | ICD-10-CM | POA: Diagnosis not present

## 2019-04-29 DIAGNOSIS — N760 Acute vaginitis: Secondary | ICD-10-CM | POA: Diagnosis not present

## 2019-04-29 DIAGNOSIS — N898 Other specified noninflammatory disorders of vagina: Secondary | ICD-10-CM | POA: Diagnosis not present

## 2019-04-29 DIAGNOSIS — Z01419 Encounter for gynecological examination (general) (routine) without abnormal findings: Secondary | ICD-10-CM | POA: Diagnosis not present

## 2019-04-29 DIAGNOSIS — Z118 Encounter for screening for other infectious and parasitic diseases: Secondary | ICD-10-CM | POA: Diagnosis not present

## 2019-04-29 DIAGNOSIS — Z681 Body mass index (BMI) 19 or less, adult: Secondary | ICD-10-CM | POA: Diagnosis not present

## 2019-05-02 DIAGNOSIS — Z0000 Encounter for general adult medical examination without abnormal findings: Secondary | ICD-10-CM | POA: Diagnosis not present

## 2019-05-02 DIAGNOSIS — F411 Generalized anxiety disorder: Secondary | ICD-10-CM | POA: Diagnosis not present

## 2019-05-02 DIAGNOSIS — N809 Endometriosis, unspecified: Secondary | ICD-10-CM | POA: Diagnosis not present

## 2019-05-02 DIAGNOSIS — Z13220 Encounter for screening for lipoid disorders: Secondary | ICD-10-CM | POA: Diagnosis not present

## 2019-05-02 DIAGNOSIS — Z862 Personal history of diseases of the blood and blood-forming organs and certain disorders involving the immune mechanism: Secondary | ICD-10-CM | POA: Diagnosis not present

## 2019-06-11 IMAGING — CR DG ABDOMEN 2V
1 series · 2 of 2 positions shown · non-contrast
Comparison: None.

CLINICAL DATA: Epigastric pain.  Nausea and vomiting.

EXAM:
ABDOMEN - 2 VIEW

[Series 1: dg abd 2 views · 0.14mm/px · 2 of 2 slices shown]
[im 1/2]
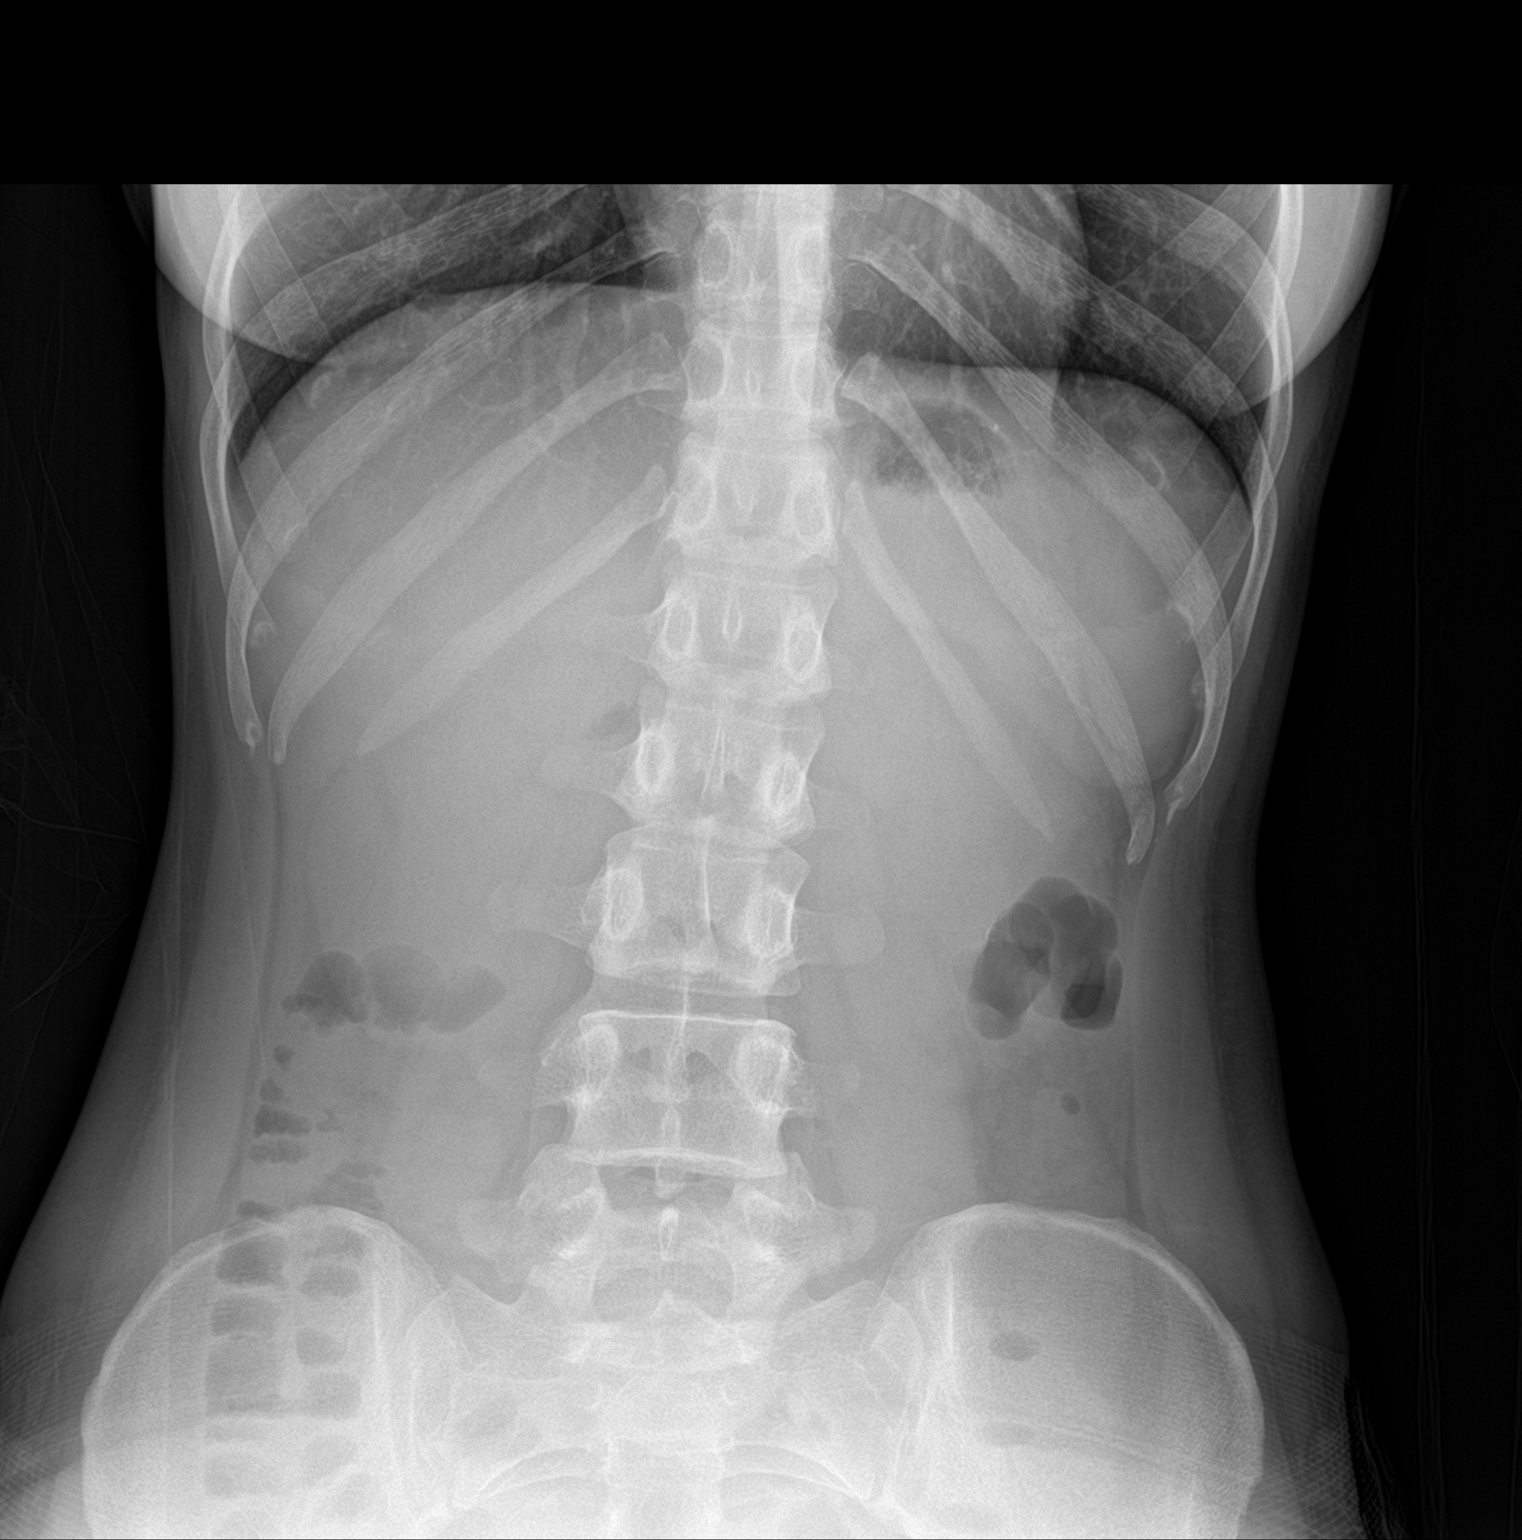
[im 2/2]
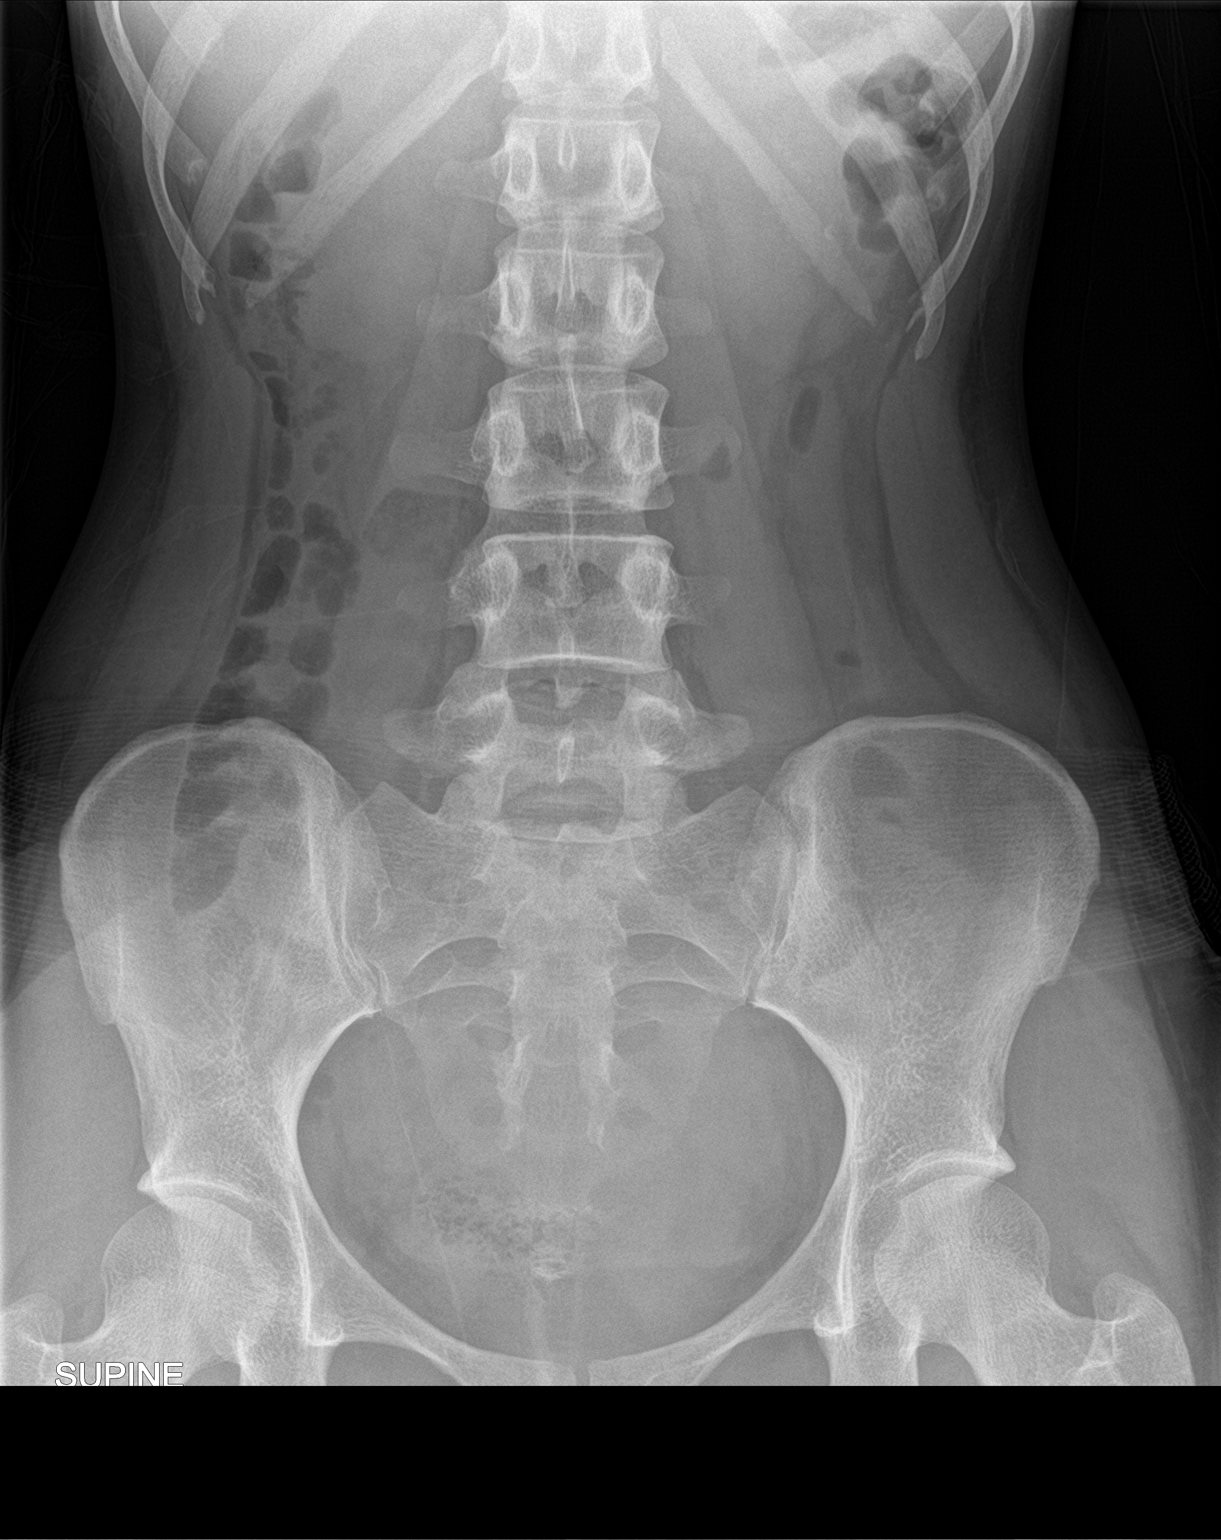

[2 of 2 positions shown; findings below may reference images not displayed]

FINDINGS: A rounded density projects over the lower pole left kidney. No other
renal or ureteral stones identified. The bowel gas pattern is
normal. No free air, portal venous gas, or pneumatosis. No other
acute abnormalities.
IMPRESSION: There is suggestion of a 4 mm stone in the left lower kidney. No
other abnormalities.

## 2019-06-11 IMAGING — US US ART/VEN ABD/PELV/SCROTUM DOPPLER LTD
1 series · 13 of 25 positions shown · non-contrast
Comparison: None.

CLINICAL DATA: Lower abdominal pain for 3 days.

EXAM:
TRANSABDOMINAL ULTRASOUND OF PELVIS
DOPPLER ULTRASOUND OF OVARIES
TECHNIQUE: Transabdominal ultrasound examination of the pelvis was performed
including evaluation of the uterus, ovaries, adnexal regions, and
pelvic cul-de-sac.
Color and duplex Doppler ultrasound was utilized to evaluate blood
flow to the ovaries.

[Series 1: us art/ven abd/pelv/scrotum doppler ltd · 13 of 39 slices shown]
[im 1/39]
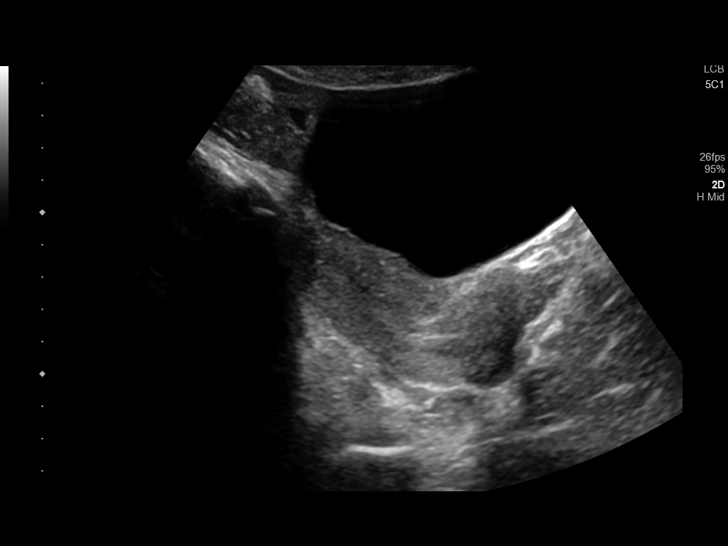
[im 4/39]
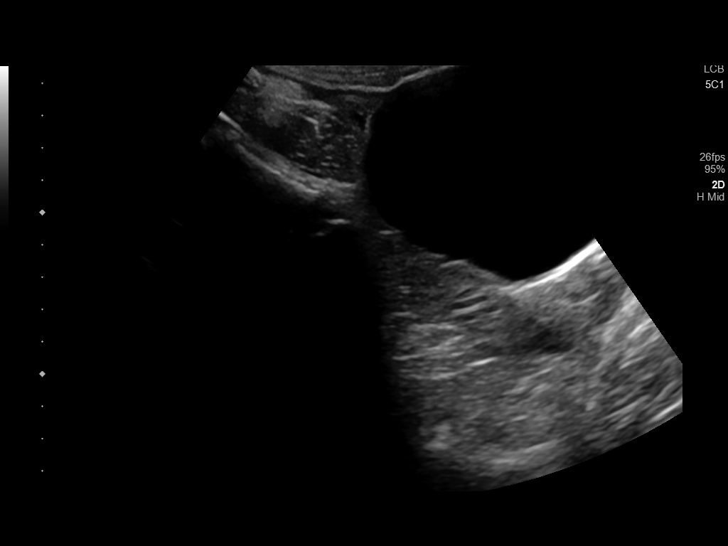
[im 7/39]
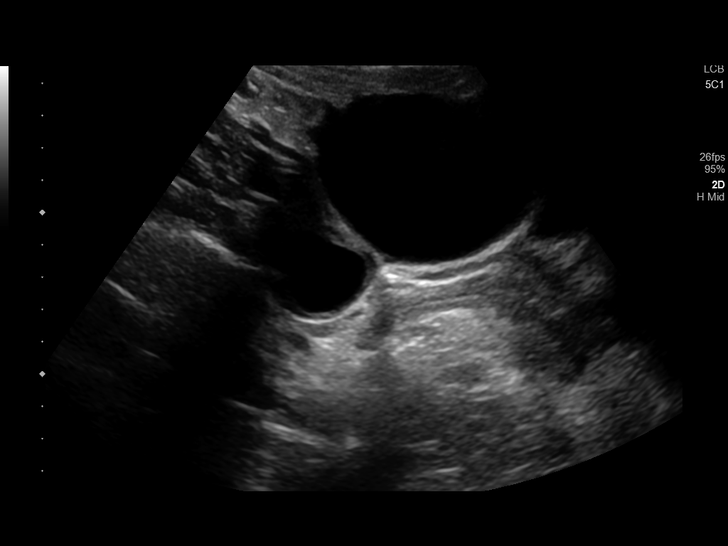
[im 10/39]
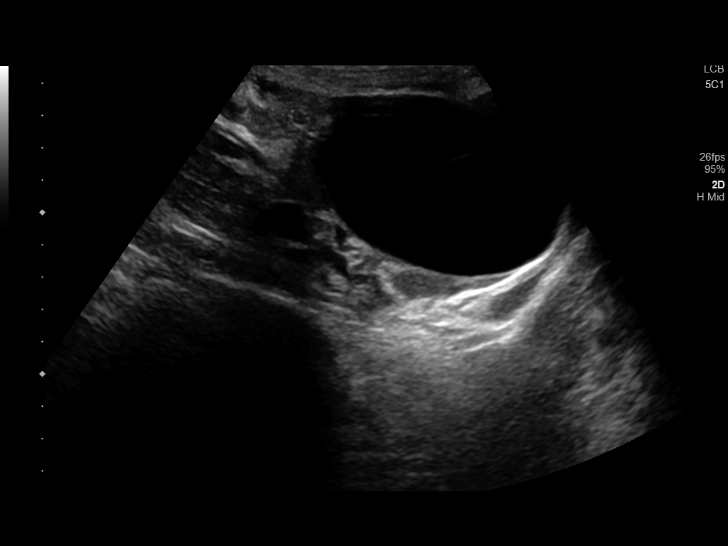
[im 13/39]
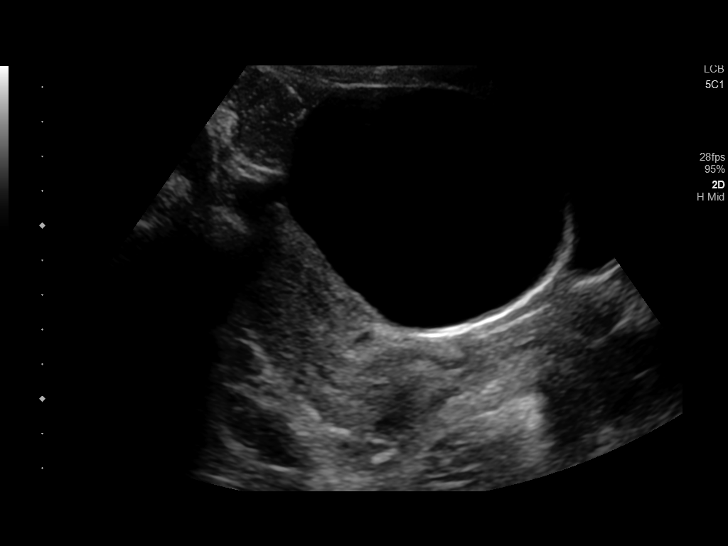
[im 16/39]
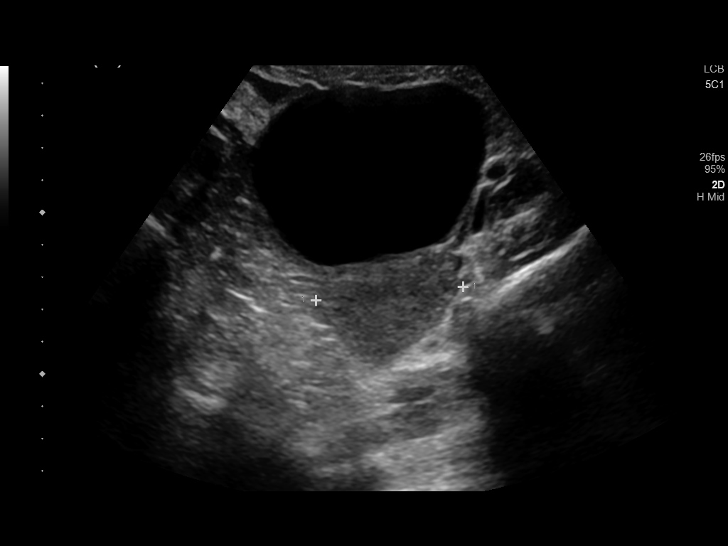
[im 20/39]
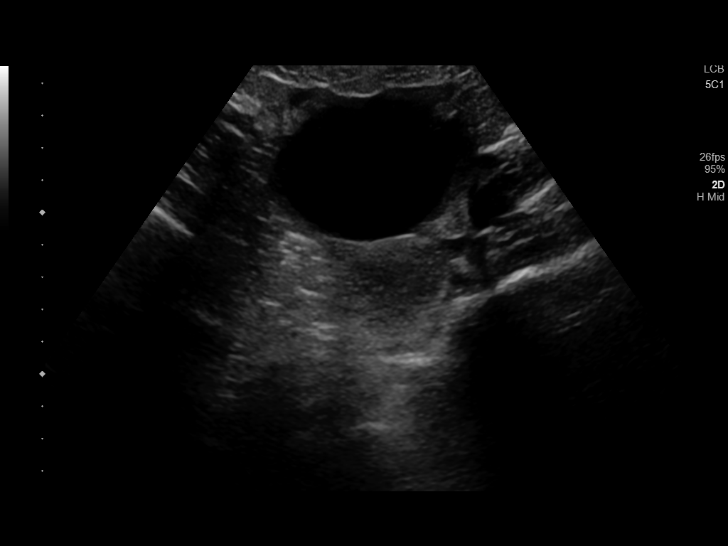
[im 23/39]
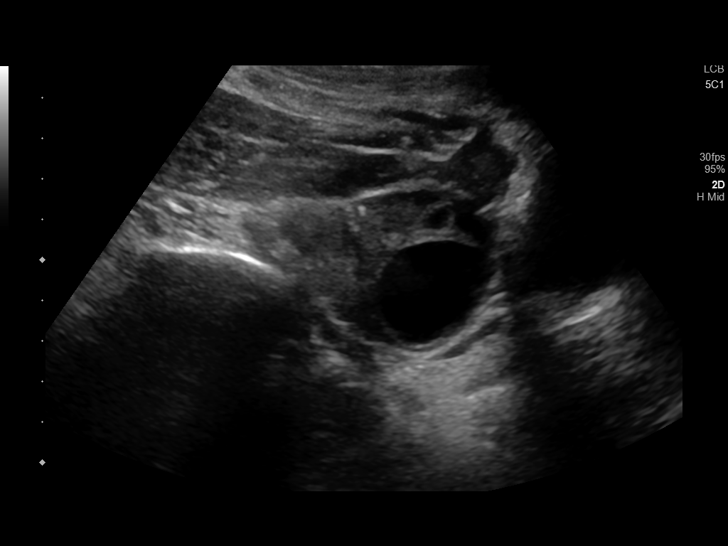
[im 26/39]
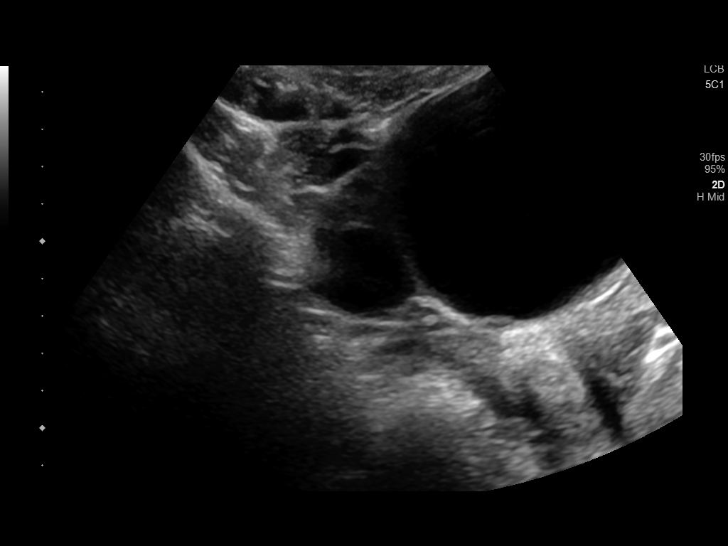
[im 29/39]
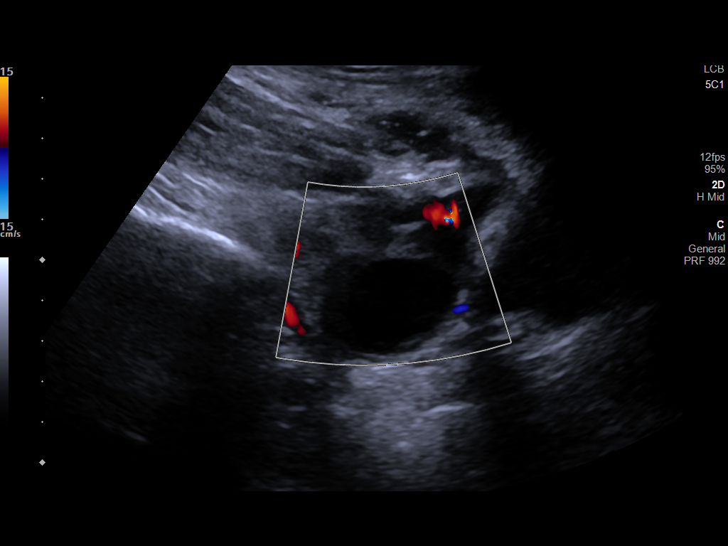
[im 32/39]
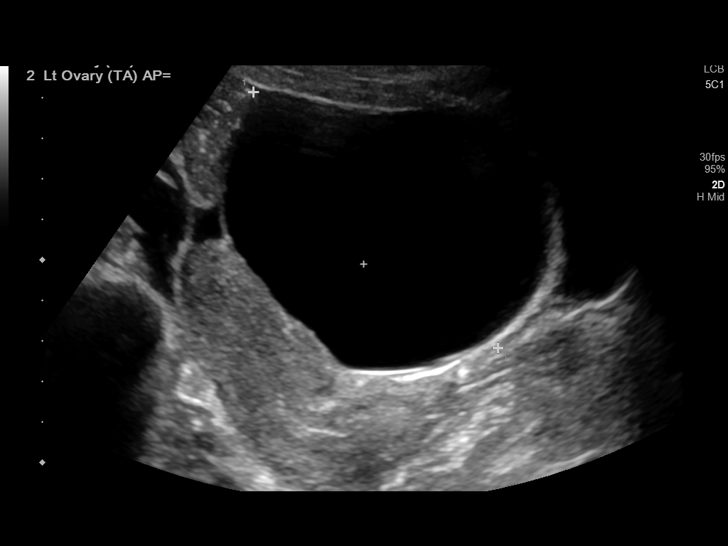
[im 35/39]
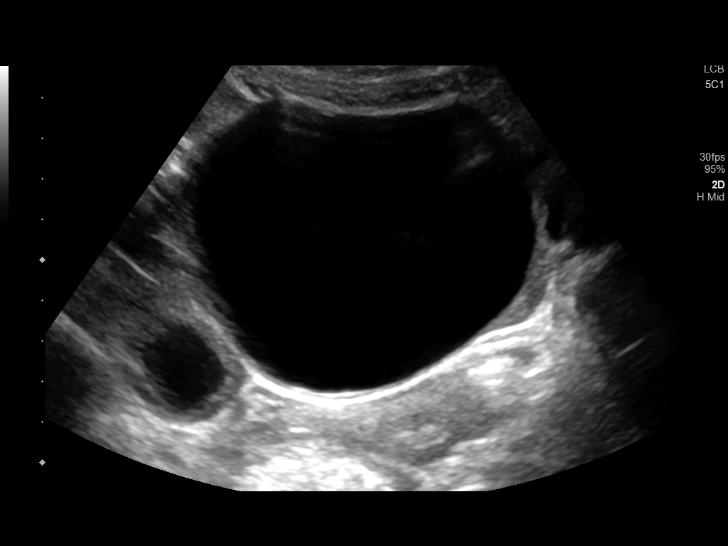
[im 39/39]
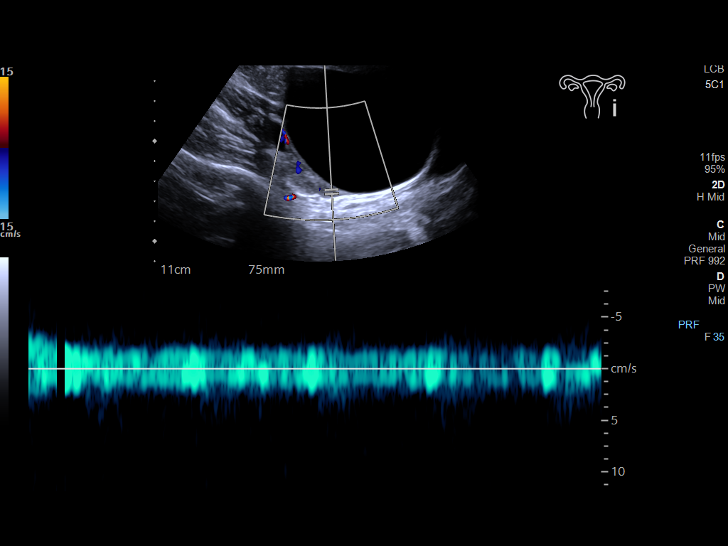

[13 of 25 positions shown; findings below may reference images not displayed]

FINDINGS: Uterus

Measurements: 7.6 x 2.7 x 4.6 cm. No fibroids or other mass
visualized.

Endometrium

Thickness: 5 mm.  No focal abnormality visualized.

Right ovary

Measurements: 4.8 x 4.1 x 2.6 cm. Contains a 2.6 x 2.3 x 2.9 cm
cyst/follicle.

Left ovary

Measurements: 8.7 x 7.2 x 9.7 cm. Contains a large cyst measuring
8.1 x 6.8 x 8.4 cm. There is a thin rim of surrounding ovarian
tissue which demonstrates blood flow.

Pulsed Doppler evaluation demonstrates normal low-resistance
arterial and venous waveforms in both ovaries.
IMPRESSION: 1. There is a large cyst in the left ovary measuring 8.1 x 6.8 x
cm. Since these may be difficult to assess completely with US,
further evaluation of simple-appearing cysts >7 cm with MRI or
surgical evaluation is recommended according to the Society of
Radiologists in Ultrasound 5787 Consensus Conference Statement (RAYYAN
Gouaan et al. Management of Asymptomatic Ovarian and other Adnexal
Cysts Imaged at US: Society of Radiologists in Ultrasound Consensus
Conference Statement 5787. Radiology [DATE]): [id number].).
2. There is a 2.9 cm cyst in the right ovary requiring no follow-up.
3. No other acute abnormalities.

## 2019-08-26 ENCOUNTER — Other Ambulatory Visit: Payer: Self-pay

## 2019-08-26 DIAGNOSIS — Z20822 Contact with and (suspected) exposure to covid-19: Secondary | ICD-10-CM

## 2019-08-28 LAB — NOVEL CORONAVIRUS, NAA: SARS-CoV-2, NAA: NOT DETECTED

## 2023-06-22 ENCOUNTER — Emergency Department
Admission: EM | Admit: 2023-06-22 | Discharge: 2023-06-22 | Disposition: A | Discharge status: Home / Self Care | Payer: Commercial Managed Care - PPO | Source: Home / Self Care

## 2023-06-23 ENCOUNTER — Encounter (HOSPITAL_COMMUNITY): Payer: Self-pay

## 2023-06-23 ENCOUNTER — Emergency Department (HOSPITAL_COMMUNITY)
Admission: EM | Admit: 2023-06-23 | Discharge: 2023-06-23 | Disposition: A | Discharge status: Home / Self Care | Payer: PRIVATE HEALTH INSURANCE | Attending: Emergency Medicine | Admitting: Emergency Medicine

## 2023-06-23 ENCOUNTER — Emergency Department (HOSPITAL_COMMUNITY): Payer: PRIVATE HEALTH INSURANCE

## 2023-06-23 ENCOUNTER — Other Ambulatory Visit: Payer: Self-pay

## 2023-06-23 DIAGNOSIS — R0781 Pleurodynia: Secondary | ICD-10-CM | POA: Insufficient documentation

## 2023-06-23 DIAGNOSIS — U071 COVID-19: Secondary | ICD-10-CM | POA: Insufficient documentation

## 2023-06-23 DIAGNOSIS — R0789 Other chest pain: Secondary | ICD-10-CM

## 2023-06-23 DIAGNOSIS — R0602 Shortness of breath: Secondary | ICD-10-CM | POA: Diagnosis present

## 2023-06-23 HISTORY — DX: Generalized anxiety disorder: F41.1

## 2023-06-23 LAB — CBC
HCT: 36.3 % (ref 36.0–46.0)
Hemoglobin: 11.2 g/dL — ABNORMAL LOW (ref 12.0–15.0)
MCH: 24.8 pg — ABNORMAL LOW (ref 26.0–34.0)
MCHC: 30.9 g/dL (ref 30.0–36.0)
MCV: 80.5 fL (ref 80.0–100.0)
Platelets: 255 10*3/uL (ref 150–400)
RBC: 4.51 MIL/uL (ref 3.87–5.11)
RDW: 13.7 % (ref 11.5–15.5)
WBC: 7.1 10*3/uL (ref 4.0–10.5)
nRBC: 0 % (ref 0.0–0.2)

## 2023-06-23 LAB — BASIC METABOLIC PANEL
Anion gap: 12 (ref 5–15)
BUN: 10 mg/dL (ref 6–20)
CO2: 22 mmol/L (ref 22–32)
Calcium: 9.2 mg/dL (ref 8.9–10.3)
Chloride: 104 mmol/L (ref 98–111)
Creatinine, Ser: 1.03 mg/dL — ABNORMAL HIGH (ref 0.44–1.00)
GFR, Estimated: >60 mL/min (ref >60)
Glucose, Bld: 98 mg/dL (ref 70–99)
Potassium: 3.7 mmol/L (ref 3.5–5.1)
Sodium: 138 mmol/L (ref 135–145)

## 2023-06-23 LAB — HCG, SERUM, QUALITATIVE: Preg, Serum: NEGATIVE

## 2023-06-23 LAB — TROPONIN I (HIGH SENSITIVITY)
Troponin I (High Sensitivity): <2 ng/L (ref <18)
Troponin I (High Sensitivity): <2 ng/L (ref <18)

## 2023-06-23 MED ORDER — NAPROXEN 500 MG PO TABS: 500.0000 mg | ORAL_TABLET | Freq: Two times a day (BID) | ORAL | 0 refills | Status: AC

## 2023-06-23 MED ORDER — IOHEXOL 350 MG/ML SOLN
55.0000 mL | Freq: Once | INTRAVENOUS | Status: AC | PRN
  Administered 2023-06-23: 55 mL via INTRAVENOUS

## 2023-06-23 NOTE — ED Triage Notes (Signed)
Pt reports sob/chest pain for the past 3 days, pt tested positive for COVID last week. Pt also has a dry cough

## 2023-06-23 NOTE — ED Notes (Signed)
Discharge instructions reviewed with pt. Pt verbalized understanding and had no questions. Family at bedside. A&Ox4.

## 2023-06-23 NOTE — ED Provider Notes (Signed)
[facility] AT [facility] Provider Note   CSN: [id number] Arrival date & time: 06/23/23  4098    History  Chief Complaint  Patient presents with   Shortness of Breath   Chest Pain   Covid Positive    Teresa Mclaughlin is a 31 y.o. female past medical history here for evaluation of chest pain and shortness of breath.  Started 2 days ago.  Tested positive for COVID 1 week ago.  Cough is nonproductive.  Feels short of breath with minimal activity.  Some pain to her central left chest.  Pleuritic.  No back pain.  No pain or swelling to lower legs.  History of PE or DVT.  Was seen by urgent care yesterday who prescribed her prednisone, azithromycin, Phenergan cough syrup, as well as Mucinex.  Symptoms not improving.  Chest pain is nonexertional in nature.  Does not radiate.  HPI     Home Medications Prior to Admission medications   Medication Sig Start Date End Date Taking? Authorizing Provider  naproxen (NAPROSYN) 500 MG tablet Take 1 tablet (500 mg total) by mouth 2 (two) times daily. 06/23/23  Yes Shena Vinluan A, PA-C  ferrous gluconate (FERGON) 324 MG tablet Take 1 tablet (324 mg total) by mouth 2 (two) times daily with a meal. Patient not taking: Reported on 11/08/2018 05/25/18   Valarie Cones, Dema Severin, PA-C  hydrOXYzine (ATARAX/VISTARIL) 25 MG tablet TAKE 1 TABLET BY MOUTH THREE TIMES A DAY 06/25/18   [place], Dema Severin, PA-C  ibuprofen (ADVIL,MOTRIN) 600 MG tablet Take 1 tablet (600 mg total) by mouth every 8 (eight) hours as needed. Patient taking differently: Take 600 mg by mouth daily as needed.  01/28/18   Governor Rooks, MD  SPRINTEC 28 0.25-35 MG-MCG tablet TAKE 1 TABLET BY MOUTH EVERY DAY CONTINUOUS ACTIVE PILLS ONLY, NO PLACEBOS 05/02/18   [provider]      Allergies    Vicodin [hydrocodone-acetaminophen], Flagyl [metronidazole], and Penicillins    Review of Systems   Review of Systems  Constitutional:  Positive for activity change. Negative  for fever.  Respiratory:  Positive for cough and shortness of breath.   Cardiovascular:  Positive for chest pain. Negative for palpitations and leg swelling.  Gastrointestinal: Negative.   Genitourinary: Negative.   Musculoskeletal: Negative.   Skin: Negative.   Neurological: Negative.   All other systems reviewed and are negative.  Physical Exam Updated Vital Signs BP 101/71 (BP Location: Right Arm)   Pulse 81   Temp 98.3 F (36.8 C) (Oral)   Resp 18   Ht 5\' 5"  (1.651 m)   Wt 54.9 kg   SpO2 100%   BMI 20.14 kg/m  Physical Exam Vitals and nursing note reviewed.  Constitutional:      General: She is not in acute distress.    Appearance: She is well-developed. She is not ill-appearing, toxic-appearing or diaphoretic.  HENT:     Head: Atraumatic.  Eyes:     Pupils: Pupils are equal, round, and reactive to light.  Cardiovascular:     Rate and Rhythm: Normal rate.     Pulses: Normal pulses.     Heart sounds: Normal heart sounds.  Pulmonary:     Effort: Pulmonary effort is normal. No respiratory distress.     Breath sounds: Normal breath sounds.     Comments: Clear Bilaterally, speaks in full sentences without difficulty Abdominal:     General: Bowel sounds are normal. There is no distension.  Palpations: Abdomen is soft.  Musculoskeletal:        General: Normal range of motion.     Cervical back: Normal range of motion.     Right lower leg: No tenderness. No edema.     Left lower leg: No tenderness. No edema.  Skin:    General: Skin is warm and dry.     Capillary Refill: Capillary refill takes less than 2 seconds.  Neurological:     General: No focal deficit present.     Mental Status: She is alert.  Psychiatric:        Mood and Affect: Mood normal.    ED Results / Procedures / Treatments   Labs (all labs ordered are listed, but only abnormal results are displayed) Labs Reviewed  BASIC METABOLIC PANEL - Abnormal; Notable for the following components:       Result Value   Creatinine, Ser 1.03 (*)    All other components within normal limits  CBC - Abnormal; Notable for the following components:   Hemoglobin 11.2 (*)    MCH 24.8 (*)    All other components within normal limits  HCG, SERUM, QUALITATIVE  TROPONIN I (HIGH SENSITIVITY)  TROPONIN I (HIGH SENSITIVITY)    EKG None  Radiology CT Angio Chest PE W and/or Wo Contrast  Result Date: 06/23/2023 CLINICAL DATA:  Shortness of breath and chest pain for 3 days. EXAM: CT ANGIOGRAPHY CHEST WITH CONTRAST TECHNIQUE: Multidetector CT imaging of the chest was performed using the standard protocol during bolus administration of intravenous contrast. Multiplanar CT image reconstructions and MIPs were obtained to evaluate the vascular anatomy. RADIATION DOSE REDUCTION: This exam was performed according to the departmental dose-optimization program which includes automated exposure control, adjustment of the mA and/or kV according to patient size and/or use of iterative reconstruction technique. CONTRAST:  55mL OMNIPAQUE IOHEXOL 350 MG/ML SOLN COMPARISON:  Same day chest radiograph FINDINGS: Cardiovascular: There is adequate opacification of the pulmonary arteries to the segmental level. There is no evidence of pulmonary embolism. Mediastinum/Nodes: The imaged thyroid is unremarkable. The esophagus is grossly unremarkable. There is no mediastinal, hilar, or axillary lymphadenopathy. Lungs/Pleura: The trachea and central airways are patent. The lungs clear, with no focal consolidation or pulmonary edema. There is no pleural effusion or pneumothorax. There are no suspicious nodules. Upper Abdomen: Unremarkable. Musculoskeletal: There is no acute osseous abnormality or suspicious osseous lesion. Review of the MIP images confirms the above findings. IMPRESSION: No evidence of pulmonary embolism or other acute cardiopulmonary pathology. Electronically Signed   By: Lesia Hausen M.D.   On: 06/23/2023 14:27   DG Chest 2  View  Result Date: 06/23/2023 CLINICAL DATA:  Chest pain.  Shortness of breath.  COVID positive EXAM: CHEST - 2 VIEW COMPARISON:  None Available. FINDINGS: The heart size and mediastinal contours are within normal limits. Both lungs are clear. No consolidation, pneumothorax or effusion. No edema. The visualized skeletal structures are unremarkable. IMPRESSION: No acute cardiopulmonary disease Electronically Signed   By: Karen Kays M.D.   On: 06/23/2023 10:00    Procedures Procedures    Medications Ordered in ED Medications  iohexol (OMNIPAQUE) 350 MG/ML injection 55 mL (55 mLs Intravenous Contrast Given 06/23/23 1341)   ED Course/ Medical Decision Making/ A&P   32 year old here for evaluation of chest pain and shortness of breath.  Recently diagnosed with COVID.  Pleuritic.  Short of breath with minimal activity.  No clinical evidence of VTE on exam.  Seen by urgent care  prescribed medications yesterday which have not helped.  Lungs clear bilaterally, speaks in full sentences without difficulty.  Mildly tachycardic.  No hypoxia.  Plan on labs, imaging and reassess  Labs and imaging personally viewed and interpreted:  CBC without leukocytosis  Metabolic panel creatinine 1.03 Trop <2 x2 Preg neg Chest xray without infiltrates, cardiomegaly, pulm edema EKG without ischemic changes CTA negative for acute abnormality  Patient reassessed.  No hypoxia.  We discussed her labs and imaging.  Question costochondritis as cause of her symptoms.  Low suspicion for unstable angina, ACS, PE, dissection, myocarditis, pericarditis, bacterial infectious process.  DC home with symptomatic management  The patient has been appropriately medically screened and/or stabilized in the ED. I have low suspicion for any other emergent medical condition which would require further screening, evaluation or treatment in the ED or require inpatient management.  Patient is hemodynamically stable and in no acute  distress.  Patient able to ambulate in department prior to ED.  Evaluation does not show acute pathology that would require ongoing or additional emergent interventions while in the emergency department or further inpatient treatment.  I have discussed the diagnosis with the patient and answered all questions.  Pain is been managed while in the emergency department and patient has no further complaints prior to discharge.  Patient is comfortable with plan discussed in room and is stable for discharge at this time.  I have discussed strict return precautions for returning to the emergency department.  Patient was encouraged to follow-up with PCP/specialist refer to at discharge.                                  Medical Decision Making Amount and/or Complexity of Data Reviewed External Data Reviewed: labs, radiology, ECG and notes. Labs: ordered. Decision-making details documented in ED Course. Radiology: ordered and independent interpretation performed. Decision-making details documented in ED Course. ECG/medicine tests: ordered and independent interpretation performed. Decision-making details documented in ED Course.  Risk OTC drugs. Prescription drug management. Parenteral controlled substances. Decision regarding hospitalization. Diagnosis or treatment significantly limited by social determinants of health.          Final Clinical Impression(s) / ED Diagnoses Final diagnoses:  COVID  Atypical chest pain    Rx / DC Orders ED Discharge Orders          Ordered    naproxen (NAPROSYN) 500 MG tablet  2 times daily        06/23/23 1500              Karita Dralle A, PA-C 06/23/23 1507    Loetta Rough, MD 06/23/23 1526

## 2023-06-23 NOTE — Discharge Instructions (Signed)
Pit was a pleasure taking care of you in the emergency department today  Your CT scan and labs are reassuring  You are likely having costochondritis which is chest wall pain from coughing and deep breathing.  I written a prescription for anti-inflammatory medication to help.  I would also stop taking the azithromycin as you do not have a bacterial infection on your CT scan  You may continue taking the other medications provided to you by the urgent care  Return for new or worsening symptoms

## 2023-10-25 ENCOUNTER — Encounter: Payer: Self-pay | Admitting: Neurology

## 2023-10-25 ENCOUNTER — Ambulatory Visit (INDEPENDENT_AMBULATORY_CARE_PROVIDER_SITE_OTHER): Payer: PRIVATE HEALTH INSURANCE | Admitting: Neurology

## 2023-10-25 VITALS — BP 109/70 | HR 77 | Ht 65.0 in | Wt 124.0 lb

## 2023-10-25 DIAGNOSIS — R110 Nausea: Secondary | ICD-10-CM

## 2023-10-25 DIAGNOSIS — G43009 Migraine without aura, not intractable, without status migrainosus: Secondary | ICD-10-CM

## 2023-10-25 MED ORDER — ONDANSETRON 4 MG PO TBDP: 4.0000 mg | ORAL_TABLET | Freq: Three times a day (TID) | ORAL | 3 refills | Status: DC | PRN

## 2023-10-25 MED ORDER — NURTEC 75 MG PO TBDP
75.0000 mg | ORAL_TABLET | Freq: Every day | ORAL | 11 refills | Status: DC | PRN
Start: 2023-10-25 — End: 2023-12-12

## 2023-10-25 NOTE — Progress Notes (Addendum)
[facility]    Provider:  Dr Lucia Gaskins Requesting Provider: Morrell Riddle, PA-C Primary Care Provider:  Morrell Riddle, PA-C  CC:  Migraine  HPI:  Teresa Mclaughlin is a 32 y.o. female here as requested by Morrell Riddle, PA-C for migraines. has History of iron deficiency anemia and Anxiety state on their problem list.  As well as from my review of [facility]'s notes she also has anxiety, iron deficiency, she has had blood work including CBC CMP lipid panel thyroid.  She follows for her mental health and is on BuSpar  Lpvely patient: Headaches since a child. She has migraines that are severe and she takes rizatriptan and she has side effects. Can't take it during the day, makes her tired and has to lay down also imitrex gave her chest and jaw tightening. She takes an extra strength tylenol which doesn;t help. She gets them behind the right, pulsating/throbbing, photo/phonophobia, nausea, no lacrimation, the migraine caps help a little, heavy head, moderate to severe, can last 24-48 hours, moderate to very severe, no lacrimation or autonomic symptoms, stays behind the right eye. She can get dull headaches and about 10 toal headache days a month a month and 6 migraine days a month. No vision changes, not positional or exertional, not worsening or changing in quality or severity or frequency, neuro exam normal no red flags to indicate imaging needed. Triggers are driving more than 2 hours and not eating is a trigger. She is well aware of triggers and lifestyle management in migraines and sleep hygiene. No medication overuse.  From a review of records, medications tried that can be used in migraine management include for greater than 3 months: Lexapro, ibuprofen, hydroxyzine, Toradol injections, Mobic, it appears she has had Demerol at 1 time, Robaxin, naloxone injections, Naprosyn, Zofran, Phenergan, tramadol, Maxalt, BuSpar, Imitrex, she had kidney stones so topiramate and zonisamide  contraindicated, she has systolic 109 so propranolol c/I indicated and caused hypotenstion, she had side effects to SSRIs and is on Buspar, amitriptyline. Bernita Raisin c/I due to being a cyp450:34a substrate as is sprintec    Reviewed notes, labs and imaging from outside physicians, which showed :  I reviewed 17 pages of notes I do not see anything specific to migraines I may have missed it she is on Maxalt but that does not matter we will examine patient today.  June 2024 CMP normal with BUN 8 and creatinine 0.66.  Diabetes in May 2023 was normal 5.8 hemoglobin A1c.  Thyroid in 6 of 2024 normal 0.8.  Also in 6 of 2024 CBC showed iron deficient anemia hemoglobin 11.8, hematocrit 33.7, MCV 77 otherwise normal.  I do not see any brain imaging.    Review of Systems: Patient complains of symptoms per HPI as well as the following symptoms anxiety. Pertinent negatives and positives per HPI. All others negative.   Social History   Socioeconomic History   Marital status: Single    Spouse name: Not on file   Number of children: Not on file   Years of education: Not on file   Highest education level: Not on file  Occupational History   Occupation: [employer], Warden/ranger  Tobacco Use   Smoking status: Former    Current packs/day: 0.00    Types: Cigarettes    Quit date: 02/02/2018    Years since quitting: 5.7   Smokeless tobacco: Never   Tobacco comments:    2 cigarettes per day  Vaping Use  Vaping status: Never Used  Substance and Sexual Activity   Alcohol use: No    Alcohol/week: 0.0 standard drinks of alcohol   Drug use: No   Sexual activity: Not Currently  Other Topics Concern   Not on file  Social History Narrative   Marital status: single; not dating      Children: none      Lives: with mom, dad in [place]      Employment: [employer]       Education:  Health Care Management - at [employer] - graduated      Tobacco: none      Alcohol;  none      Drug: none      Exercise: none   Social Drivers of Health   Financial Resource Strain: Low Risk  (04/10/2023)   Received from [facility]   Overall Financial Resource Strain (CARDIA)    Difficulty of Paying Living Expenses: Not hard at all  Food Insecurity: No Food Insecurity (04/10/2023)   [facility]   Hunger Vital Sign    Worried About Running Out of Food in the Last Year: Never true    Ran Out of Food in the Last Year: Never true  Transportation Needs: No Transportation Needs (04/10/2023)   Received from [facility]    Lack of Transportation (Medical): No    Lack of Transportation (Non-Medical): No  Physical Activity: Not on file  Stress: Not on file  Social Connections: Unknown (03/08/2022)   Received from [facility]   Social Network    Social Network: Not on file  Intimate Partner Violence: Unknown (02/07/2022)   Received from [facility]   HITS    Physically Hurt: Not on file    Insult or Talk Down To: Not on file    Threaten Physical Harm: Not on file    Scream or Curse: Not on file    Family History  Problem Relation Age of Onset   Diabetes Mother    Hypertension Mother    High Cholesterol Father    Post-traumatic stress disorder Father        from military service   Dementia Maternal Grandmother    Diabetes Paternal Grandfather    Migraines Neg Hx     Past Medical History:  Diagnosis Date   Allergy    Anemia    Endometriosis    Generalized anxiety disorder    Iron deficiency anemia 12/19/2011    Patient Active Problem List   Diagnosis Date Noted   Anxiety state 12/08/2017   History of iron deficiency anemia 12/19/2011    Past Surgical History:  Procedure Laterality Date   ovarian cyst removed     ROBOTIC ASSISTED LAPAROSCOPIC OVARIAN CYSTECTOMY Left 03/29/2018   Procedure: XI ROBOTIC ASSISTED LAPAROSCOPIC OVARIAN CYSTECTOMY, EXCISION OF PELVIC ENDOMETRIOSIS;  Surgeon: Maxie Better,  MD;  Location: [facility];  Service: Gynecology;  Laterality: Left;  Requests 2 hrs.    Current Outpatient Medications  Medication Sig Dispense Refill   busPIRone (BUSPAR) 10 MG tablet Take by mouth.     ferrous gluconate (FERGON) 324 MG tablet Take 1 tablet (324 mg total) by mouth 2 (two) times daily with a meal. 90 tablet 3   naproxen (NAPROSYN) 500 MG tablet Take 1 tablet (500 mg total) by mouth 2 (two) times daily. 30 tablet 0   ondansetron (ZOFRAN-ODT) 4 MG disintegrating tablet Take 1-2 tablets (4-8 mg total) by mouth every  8 (eight) hours as needed. 30 tablet 3   Rimegepant Sulfate (NURTEC) 75 MG TBDP Take 1 tablet (75 mg total) by mouth daily as needed. For migraines. Take as close to onset of migraine as possible. One daily maximum. PLEASE RUN COPAY CARD BIN#: [id number]  PCN#: CN GRP#: [id number]  NG#:[id number] 16 tablet 11   SPRINTEC 28 0.25-35 MG-MCG tablet TAKE 1 TABLET BY MOUTH EVERY DAY CONTINUOUS ACTIVE PILLS ONLY, NO PLACEBOS  0   No current facility-administered medications for this visit.    Allergies as of 10/25/2023 - Review Complete 10/25/2023  Allergen Reaction Noted   Vicodin [hydrocodone-acetaminophen] Itching 03/29/2018   Flagyl [metronidazole] Other (See Comments) 01/30/2018   Penicillins Other (See Comments) 12/19/2011    Vitals: BP 109/70 (Cuff Size: Normal)   Pulse 77   Ht 5\' 5"  (1.651 m)   Wt 124 lb (56.2 kg)   BMI 20.63 kg/m  Last Weight:  Wt Readings from Last 1 Encounters:  10/25/23 124 lb (56.2 kg)   Last Height:   Ht Readings from Last 1 Encounters:  10/25/23 5\' 5"  (1.651 m)     Physical exam: Exam: Gen: NAD, conversant, well nourised, well groomed                     CV: RRR, no MRG. No Carotid Bruits. No peripheral edema, warm, nontender Eyes: Conjunctivae clear without exudates or hemorrhage  Neuro: Detailed Neurologic Exam  Speech:    Speech is normal; fluent and spontaneous with normal comprehension.  Cognition:    The patient is  oriented to person, place, and time;     recent and remote memory intact;     language fluent;     normal attention, concentration,     fund of knowledge Cranial Nerves:    The pupils are equal, round, and reactive to light. The fundi are normal and spontaneous venous pulsations are present. Visual fields are full to finger confrontation. Extraocular movements are intact. Trigeminal sensation is intact and the muscles of mastication are normal. The face is symmetric. The palate elevates in the midline. Hearing intact. Voice is normal. Shoulder shrug is normal. The tongue has normal motion without fasciculations.   Coordination: nml  Gait: nml  Motor Observation:    No asymmetry, no atrophy, and no involuntary movements noted. Tone:    Normal muscle tone.    Posture:    Posture is normal. normal erect    Strength:    Strength is V/V in the upper and lower limbs.      Sensation: intact to LT     Reflex Exam:  DTR's:    Deep tendon reflexes in the upper and lower extremities are normal bilaterally.   Toes:    The toes are downgoing bilaterally.   Clonus:    Clonus is absent.    Assessment/Plan:  Patient with episodic migraines. She does not want preventative, would like acute, No medication overuse.  Start Nurtec No indication for brain imaging  AT ONSET of headache/migraine take Nurtec and Ondansetron. If need can also take with Maxalt and excedrin or ibuprofen/alleve  Next can try Ubrelvy(oral, will look into interaction with sprintec) or Zavzpret(nasal)  Discussed teratogenicity do not get pregnant   Meds ordered this encounter  Medications   Rimegepant Sulfate (NURTEC) 75 MG TBDP    Sig: Take 1 tablet (75 mg total) by mouth daily as needed. For migraines. Take as close to onset of migraine as possible. One daily maximum.  PLEASE RUN COPAY CARD BIN#: [id number]  PCN#: CN GRP#: [id number]  UJ#:[id number]    Dispense:  16 tablet    Refill:  11    PLEASE RUN COPAY CARD  BIN#: [id number]  PCN#: CN GRP#: [id number]  BM#:[id number]   ondansetron (ZOFRAN-ODT) 4 MG disintegrating tablet    Sig: Take 1-2 tablets (4-8 mg total) by mouth every 8 (eight) hours as needed.    Dispense:  30 tablet    Refill:  3    Cc: Valarie Cones, Jaci Lazier, PA-C  Naomie Dean, MD  [facility] [address] [place], [place] [address]  Phone [phone] Fax [phone]  I spent over 45 minutes of face-to-face and non-face-to-face time with patient on the  1. Migraine without aura and without status migrainosus, not intractable   2. Nausea    diagnosis.  This included previsit chart review, lab review, study review, order entry, electronic health record documentation, patient education on the different diagnostic and therapeutic options, counseling and coordination of care, risks and benefits of management, compliance, or risk factor reduction

## 2023-10-25 NOTE — Patient Instructions (Addendum)
AT ONSET of headache/migraine take Nurtec and Ondansetron. If need can also take with Maxalt and excedrin or ibuprofen/alleve  Next can try Ubrelvy(oral, will look into interaction with sprintec) or Zavzpret(nasal)  Rimegepant Disintegrating Tablets What is this medication? RIMEGEPANT (ri ME je pant) prevents and treats migraines. It works by blocking a substance in the body that causes migraines. This medicine may be used for other purposes; ask your health care provider or pharmacist if you have questions. COMMON BRAND NAME(S): NURTEC ODT What should I tell my care team before I take this medication? They need to know if you have any of these conditions: Kidney disease Liver disease An unusual or allergic reaction to rimegepant, other medications, foods, dyes, or preservatives Pregnant or trying to get pregnant Breast-feeding How should I use this medication? Take this medication by mouth. Take it as directed on the prescription label. Leave the tablet in the sealed pack until you are ready to take it. With dry hands, open the pack and gently remove the tablet. If the tablet breaks or crumbles, throw it away. Use a new tablet. Place the tablet in the mouth and allow it to dissolve. Then, swallow it. Do not cut, crush, or chew this medication. You do not need water to take this medication. Talk to your care team about the use of this medication in children. Special care may be needed. Overdosage: If you think you have taken too much of this medicine contact a poison control center or emergency room at once. NOTE: This medicine is only for you. Do not share this medicine with others. What if I miss a dose? This does not apply. This medication is not for regular use. What may interact with this medication? Certain medications for fungal infections, such as fluconazole, itraconazole Rifampin This list may not describe all possible interactions. Give your health care provider a list of all the  medicines, herbs, non-prescription drugs, or dietary supplements you use. Also tell them if you smoke, drink alcohol, or use illegal drugs. Some items may interact with your medicine. What should I watch for while using this medication? Visit your care team for regular checks on your progress. Tell your care team if your symptoms do not start to get better or if they get worse. What side effects may I notice from receiving this medication? Side effects that you should report to your care team as soon as possible: Allergic reactions--skin rash, itching, hives, swelling of the face, lips, tongue, or throat Side effects that usually do not require medical attention (report to your care team if they continue or are bothersome): Nausea Stomach pain This list may not describe all possible side effects. Call your doctor for medical advice about side effects. You may report side effects to FDA at 1-800-FDA-1088. Where should I keep my medication? Keep out of the reach of children and pets. Store at room temperature between 20 and 25 degrees C (68 and 77 degrees F). Get rid of any unused medication after the expiration date. To get rid of medications that are no longer needed or have expired: Take the medication to a medication take-back program. Check with your pharmacy or law enforcement to find a location. If you cannot return the medication, check the label or package insert to see if the medication should be thrown out in the garbage or flushed down the toilet. If you are not sure, ask your care team. If it is safe to put it in the trash, take the  medication out of the container. Mix the medication with cat litter, dirt, coffee grounds, or other unwanted substance. Seal the mixture in a bag or container. Put it in the trash. NOTE: This sheet is a summary. It may not cover all possible information. If you have questions about this medicine, talk to your doctor, pharmacist, or health care provider.  2024  Elsevier/Gold Standard (2021-12-15 00:00:00)  Ondansetron Dissolving Tablets What is this medication? ONDANSETRON (on DAN se tron) prevents nausea and vomiting from chemotherapy, radiation, or surgery. It works by blocking substances in the body that may cause nausea or vomiting. It belongs to a group of medications called antiemetics. This medicine may be used for other purposes; ask your health care provider or pharmacist if you have questions. COMMON BRAND NAME(S): Zofran ODT What should I tell my care team before I take this medication? They need to know if you have any of these conditions: Heart disease Irregular heartbeat or rhythm Liver disease Low levels of magnesium or potassium in the blood An unusual or allergic reaction to ondansetron, other medications, foods, dyes, or preservatives Pregnant or trying to get pregnant Breastfeeding How should I use this medication? Take this medication by mouth. Take it as directed on the prescription label at the same time every day. You do not need water to take this medication. Leave the tablet in the sealed pack until you are ready to take it. With dry hands, open the pack and gently remove the tablet. Place the tablet in the mouth and allow it to dissolve. Then, swallow it. Talk to your care team about the use of this medication in children. Special care may be needed. Overdosage: If you think you have taken too much of this medicine contact a poison control center or emergency room at once. NOTE: This medicine is only for you. Do not share this medicine with others. What if I miss a dose? If you miss a dose, take it as soon as you can. If it is almost time for your next dose, take only that dose. Do not take double or extra doses. What may interact with this medication? Do not take this medication with any of the following: Apomorphine Certain medications for fungal infections, such as fluconazole, ketoconazole,  posaconazole Cisapride Dronedarone Levoketoconazole Pimozide Quinidine Thioridazine This medication may also interact with the following: Certain medications for depression, anxiety, or other mental health conditions Certain medications for migraines, such as sumatriptan Linezolid Methylene blue Opioids Other medications that cause heart rhythm changes, such as dofetilide or ziprasidone St. John's wort Stimulant medications for ADHD, weight loss, or staying awake Tryptophan This list may not describe all possible interactions. Give your health care provider a list of all the medicines, herbs, non-prescription drugs, or dietary supplements you use. Also tell them if you smoke, drink alcohol, or use illegal drugs. Some items may interact with your medicine. What should I watch for while using this medication? Check with your care team as soon as you can if you have any sign of an allergic reaction. What side effects may I notice from receiving this medication? Side effects that you should report to your care team as soon as possible: Allergic reactions--skin rash, itching, hives, swelling of the face, lips, tongue, or throat Bowel blockage--stomach cramping, unable to have a bowel movement or pass gas, loss of appetite, vomiting Chest pain (angina)--pain, pressure, or tightness in the chest, neck, back, or arms Heart rhythm changes--fast or irregular heartbeat, dizziness, feeling faint or lightheaded,  chest pain, trouble breathing Irritability, confusion, fast or irregular heartbeat, muscle stiffness, twitching muscles, sweating, high fever, seizure, chills, vomiting, diarrhea, which may be signs of serotonin syndrome Side effects that usually do not require medical attention (report to your care team if they continue or are bothersome): Constipation Diarrhea General discomfort and fatigue Headache This list may not describe all possible side effects. Call your doctor for medical advice  about side effects. You may report side effects to FDA at 1-800-FDA-1088. Where should I keep my medication? Keep out of the reach of children and pets. Store between 2 and 30 degrees C (36 and 86 degrees F). Throw away any unused medication after the expiration date. NOTE: This sheet is a summary. It may not cover all possible information. If you have questions about this medicine, talk to your doctor, pharmacist, or health care provider.  2024 Elsevier/Gold Standard (2022-12-28 00:00:00)

## 2023-11-24 ENCOUNTER — Encounter: Payer: Self-pay | Admitting: Neurology

## 2023-11-27 ENCOUNTER — Telehealth: Payer: Self-pay

## 2023-11-27 ENCOUNTER — Other Ambulatory Visit (HOSPITAL_COMMUNITY): Payer: Self-pay

## 2023-11-27 NOTE — Telephone Encounter (Signed)
Pharmacy Patient Advocate Encounter   Received notification from CoverMyMeds that prior authorization for Nurtec 75MG  dispersible tablets is required/requested.   Insurance verification completed.   The patient is insured through [employer] .   Per test claim: PA required; PA submitted to above mentioned insurance via CoverMyMeds Key/confirmation #/EOC [id number] Status is pending

## 2023-11-28 NOTE — Telephone Encounter (Signed)
   Insurance sent a fax requesting this information-I do not see it documented in chart about MOH. Please advise-Thanks.

## 2023-12-03 NOTE — Telephone Encounter (Signed)
Done. thanks

## 2023-12-04 ENCOUNTER — Other Ambulatory Visit (HOSPITAL_COMMUNITY): Payer: Self-pay

## 2023-12-04 ENCOUNTER — Other Ambulatory Visit: Payer: Self-pay | Admitting: Neurology

## 2023-12-04 DIAGNOSIS — R110 Nausea: Secondary | ICD-10-CM

## 2023-12-04 DIAGNOSIS — G43009 Migraine without aura, not intractable, without status migrainosus: Secondary | ICD-10-CM

## 2023-12-04 NOTE — Telephone Encounter (Signed)
Sent message to pt that approval for nurtec was given. She may pick up from pharmacy.

## 2023-12-04 NOTE — Telephone Encounter (Signed)
Pharmacy Patient Advocate Encounter   Received notification from Physician's Office that prior authorization for Nurtec 75MG  dispersible tablets is required/requested.   Insurance verification completed.   The patient is insured through [employer] .   Per test claim: PA required; PA submitted to above mentioned insurance via CoverMyMeds Key/confirmation #/EOC [id number] Status is pending

## 2023-12-04 NOTE — Telephone Encounter (Signed)
Pharmacy Patient Advocate Encounter  Received notification from [facility] that Prior Authorization for Nurtec 75MG  dispersible tablets has been APPROVED from 12/04/2023 to 12/03/2024. Ran test claim, Copay is $0. This test claim was processed through [facility]- copay amounts may vary at other pharmacies due to pharmacy/plan contracts, or as the patient moves through the different stages of their insurance plan.  Insurance will only allow 8 tablet per month.  PA #/Case ID/Reference #: PA Case ID #: [id number]

## 2023-12-05 NOTE — Telephone Encounter (Signed)
Already been approved and patient notified-see other encounter

## 2023-12-08 NOTE — Telephone Encounter (Signed)
Already been approved and patient notified-see other encounter.  Please do not route back to PA Team

## 2023-12-11 ENCOUNTER — Other Ambulatory Visit: Payer: Self-pay | Admitting: *Deleted

## 2024-05-13 ENCOUNTER — Telehealth: Payer: Self-pay | Admitting: Neurology

## 2024-05-13 NOTE — Telephone Encounter (Signed)
 Patient called to cancel appointment due to medicine is working and do not need an appointment.

## 2024-05-14 ENCOUNTER — Telehealth: Payer: PRIVATE HEALTH INSURANCE | Admitting: Family Medicine

## 2024-10-16 NOTE — Patient Instructions (Signed)
 Below is our plan:  We will continue Nurtec and ondansetron  as needed.   Please make sure you are staying well hydrated. I recommend 50-60 ounces daily. Well balanced diet and regular exercise encouraged. Consistent sleep schedule with 6-8 hours recommended.   Please continue follow up with care team as directed.   Follow up with me in 1 year   You may receive a survey regarding today's visit. I encourage you to leave honest feed back as I do use this information to improve patient care. Thank you for seeing me today!   GENERAL HEADACHE INFORMATION:   Natural supplements: Magnesium Oxide or Magnesium Glycinate 500 mg at bed (up to 800 mg daily) Coenzyme Q10 300 mg in AM Vitamin B2- 200 mg twice a day   Add 1 supplement at a time since even natural supplements can have undesirable side effects. You can sometimes buy supplements cheaper (especially Coenzyme Q10) at www.webmailguide.co.za or at [facility].  Migraine with aura: There is increased risk for stroke in women with migraine with aura and a contraindication for the combined contraceptive pill for use by women who have migraine with aura. The risk for women with migraine without aura is lower. However other risk factors like smoking are far more likely to increase stroke risk than migraine. There is a recommendation for no smoking and for the use of OCPs without estrogen such as progestogen only pills particularly for women with migraine with aura.SABRA People who have migraine headaches with auras may be 3 times more likely to have a stroke caused by a blood clot, compared to migraine patients who don't see auras. Women who take hormone-replacement therapy may be 30 percent more likely to suffer a clot-based stroke than women not taking medication containing estrogen. Other risk factors like smoking and high blood pressure may be  much more important.    Vitamins and herbs that show potential:   Magnesium: Magnesium (250 mg twice a day or 500 mg at  bed) has a relaxant effect on smooth muscles such as blood vessels. Individuals suffering from frequent or daily headache usually have low magnesium levels which can be increase with daily supplementation of 400-750 mg. Three trials found 40-90% average headache reduction  when used as a preventative. Magnesium may help with headaches are aura, the best evidence for magnesium is for migraine with aura is its thought to stop the cortical spreading depression we believe is the pathophysiology of migraine aura.Magnesium also demonstrated the benefit in menstrually related migraine.  Magnesium is part of the messenger system in the serotonin cascade and it is a good muscle relaxant.  It is also useful for constipation which can be a side effect of other medications used to treat migraine. Good sources include nuts, whole grains, and tomatoes. Side Effects: loose stool/diarrhea  Riboflavin (vitamin B 2) 200 mg twice a day. This vitamin assists nerve cells in the production of ATP a principal energy storing molecule.  It is necessary for many chemical reactions in the body.  There have been at least 3 clinical trials of riboflavin using 400 mg per day all of which suggested that migraine frequency can be decreased.  All 3 trials showed significant improvement in over half of migraine sufferers.  The supplement is found in bread, cereal, milk, meat, and poultry.  Most Americans get more riboflavin than the recommended daily allowance, however riboflavin deficiency is not necessary for the supplements to help prevent headache. Side effects: energizing, green urine   Coenzyme Q10:  This is present in almost all cells in the body and is critical component for the conversion of energy.  Recent studies have shown that a nutritional supplement of CoQ10 can reduce the frequency of migraine attacks by improving the energy production of cells as with riboflavin.  Doses of 150 mg twice a day have been shown to be effective.    Melatonin: Increasing evidence shows correlation between melatonin secretion and headache conditions.  Melatonin supplementation has decreased headache intensity and duration.  It is widely used as a sleep aid.  Sleep is natures way of dealing with migraine.  A dose of 3 mg is recommended to start for headaches including cluster headache. Higher doses up to 15 mg has been reviewed for use in Cluster headache and have been used. The rationale behind using melatonin for cluster is that many theories regarding the cause of Cluster headache center around the disruption of the normal circadian rhythm in the brain.  This helps restore the normal circadian rhythm.   HEADACHE DIET: Foods and beverages which may trigger migraine Note that only 20% of headache patients are food sensitive. You will know if you are food sensitive if you get a headache consistently 20 minutes to 2 hours after eating a certain food. Only cut out a food if it causes headaches, otherwise you might remove foods you enjoy! What matters most for diet is to eat a well balanced healthy diet full of vegetables and low fat protein, and to not miss meals.   Chocolate, other sweets ALL cheeses except cottage and cream cheese Dairy products, yogurt, sour cream, ice cream Liver Meat extracts (Bovril, Marmite, meat tenderizers) Meats or fish which have undergone aging, fermenting, pickling or smoking. These include: Hotdogs,salami,Lox,sausage, mortadellas,smoked salmon, pepperoni, Pickled herring Pods of broad bean (English beans, Chinese pea pods, Italian (fava) beans, lima and navy beans Ripe avocado, ripe banana Yeast extracts or active yeast preparations such as Brewer's or Fleishman's (commercial bakes goods are permitted) Tomato based foods, pizza (lasagna, etc.)   MSG (monosodium glutamate) is disguised as many things; look for these common aliases: Monopotassium glutamate Autolysed yeast Hydrolysed protein Sodium  caseinate flavorings all natural preservatives Nutrasweet   Avoid all other foods that convincingly provoke headaches.   Resources: The Dizzy Bluford Aid Your Headache Diet, migrainestrong.com  https://zamora-andrews.com/   Caffeine and Migraine For patients that have migraine, caffeine intake more than 3 days per week can lead to dependency and increased migraine frequency. I would recommend cutting back on your caffeine intake as best you can. The recommended amount of caffeine is 200-300 mg daily, although migraine patients may experience dependency at even lower doses. While you may notice an increase in headache temporarily, cutting back will be helpful for headaches in the long run. For more information on caffeine and migraine, visit: https://americanmigrainefoundation.org/resource-library/caffeine-and-migraine/   Headache Prevention Strategies:   1. Maintain a headache diary; learn to identify and avoid triggers.  - This can be a simple note where you log when you had a headache, associated symptoms, and medications used - There are several smartphone apps developed to help track migraines: Migraine Buddy, Migraine Monitor, Curelator N1-Headache App   Common triggers include: Emotional triggers: Emotional/Upset family or friends Emotional/Upset occupation Business reversal/success Anticipation anxiety Crisis-serious Post-crisis periodNew job/position   Physical triggers: Vacation Day Weekend Strenuous Exercise High Altitude Location New Move Menstrual Day Physical Illness Oversleep/Not enough sleep Weather changes Light: Photophobia or light sesnitivity treatment involves a balance between desensitization and reduction in overly  strong input. Use dark polarized glasses outside, but not inside. Avoid bright or fluorescent light, but do not dim environment to the point that going into a normally lit room hurts. Consider  FL-41 tint lenses, which reduce the most irritating wavelengths without blocking too much light.  These can be obtained at axonoptics.com or theraspecs.com Foods: see list above.   2. Limit use of acute treatments (over-the-counter medications, triptans, etc.) to no more than 2 days per week or 10 days per month to prevent medication overuse headache (rebound headache).     3. Follow a regular schedule (including weekends and holidays): Don't skip meals. Eat a balanced diet. 8 hours of sleep nightly. Minimize stress. Exercise 30 minutes per day. Being overweight is associated with a 5 times increased risk of chronic migraine. Keep well hydrated and drink 6-8 glasses of water per day.   4. Initiate non-pharmacologic measures at the earliest onset of your headache. Rest and quiet environment. Relax and reduce stress. Breathe2Relax is a free app that can instruct you on    some simple relaxtion and breathing techniques. Http://Dawnbuse.com is a    free website that provides teaching videos on relaxation.  Also, there are  many apps that   can be downloaded for mindful relaxation.  An app called YOGA NIDRA will help walk you through mindfulness. Another app called Calm can be downloaded to give you a structured mindfulness guide with daily reminders and skill development. Headspace for guided meditation Mindfulness Based Stress Reduction Online Course: www.palousemindfulness.com Cold compresses.   5. Don't wait!! Take the maximum allowable dosage of prescribed medication at the first sign of migraine.   6. Compliance:  Take prescribed medication regularly as directed and at the first sign of a migraine.   7. Communicate:  Call your physician when problems arise, especially if your headaches change, increase in frequency/severity, or become associated with neurological symptoms (weakness, numbness, slurred speech, etc.). Proceed to emergency room if you experience new or worsening symptoms or  symptoms do not resolve, if you have new neurologic symptoms or if headache is severe, or for any concerning symptom.   8. Headache/pain management therapies: Consider various complementary methods, including medication, behavioral therapy, psychological counselling, biofeedback, massage therapy, acupuncture, dry needling, and other modalities.  Such measures may reduce the need for medications. Counseling for pain management, where patients learn to function and ignore/minimize their pain, seems to work very well.   9. Recommend changing family's attention and focus away from patient's headaches. Instead, emphasize daily activities. If first question of day is 'How are your headaches/Do you have a headache today?', then patient will constantly think about headaches, thus making them worse. Goal is to re-direct attention away from headaches, toward daily activities and other distractions.   10. Helpful Websites: www.AmericanHeadacheSociety.org patenthood.ch www.headaches.org tightmarket.nl www.achenet.org

## 2024-10-16 NOTE — Progress Notes (Signed)
 PATIENT: Teresa Mclaughlin DOB: [birth year]  REASON FOR VISIT: follow up HISTORY FROM: patient  Virtual Visit via MyChart video  I connected with Stephenie J Maltos on 10/17/2024 at  9:45 AM EST via MyChart video and verified that I am speaking with the correct person using two identifiers.   I discussed the limitations, risks, security and privacy concerns of performing an evaluation and management service by Mychart video and the availability of in person appointments. I also discussed with the patient that there may be a patient responsible charge related to this service. The patient expressed understanding and agreed to proceed.   History of Present Illness:  10/17/2024 ALL (MyChart): Teresa Mclaughlin is a 33 y.o. female here today for follow up for migraines. She continues Nurtec and ondansetron  as needed. She reports doing well. She may take Nurtec once monthly or less. During the summer months she may take it 3-4 times per month. Ondansetron  not taken very often but really helps with nausea.   History (copied from Dr Sharion previous note)  HPI:  Reha J Hipps is a 33 y.o. female here as requested by Allen, Lauraine CROME, PA-C for migraines. has History of iron deficiency anemia and Anxiety state on their problem list.  As well as from my review of [facility]'s notes she also has anxiety, iron deficiency, she has had blood work including CBC CMP lipid panel thyroid.  She follows for her mental health and is on BuSpar   Lpvely patient: Headaches since a child. She has migraines that are severe and she takes rizatriptan and she has side effects. Can't take it during the day, makes her tired and has to lay down also imitrex gave her chest and jaw tightening. She takes an extra strength tylenol  which doesn;t help. She gets them behind the right, pulsating/throbbing, photo/phonophobia, nausea, no lacrimation, the migraine caps help a little, heavy head, moderate to severe, can last 24-48 hours,  moderate to very severe, no lacrimation or autonomic symptoms, stays behind the right eye. She can get dull headaches and about 10 toal headache days a month a month and 6 migraine days a month. No vision changes, not positional or exertional, not worsening or changing in quality or severity or frequency, neuro exam normal no red flags to indicate imaging needed. Triggers are driving more than 2 hours and not eating is a trigger. She is well aware of triggers and lifestyle management in migraines and sleep hygiene. No medication overuse.   From a review of records, medications tried that can be used in migraine management include for greater than 3 months: Lexapro , ibuprofen , hydroxyzine , Toradol  injections, Mobic, it appears she has had Demerol  at 1 time, Robaxin, naloxone  injections, Naprosyn , Zofran , Phenergan , tramadol , Maxalt, BuSpar, Imitrex, she had kidney stones so topiramate and zonisamide contraindicated, she has systolic 109 so propranolol c/I indicated and caused hypotenstion, she had side effects to SSRIs and is on Buspar, amitriptyline. Holland c/I due to being a [id number]:34a substrate as is sprintec   Observations/Objective:  Generalized: Well developed, in no acute distress  Mentation: Alert oriented to time, place, history taking. Follows all commands speech and language fluent   Assessment and Plan:  33 y.o. year old female  has a past medical history of Allergy, Anemia, Endometriosis, Generalized anxiety disorder, and Iron deficiency anemia (12/19/2011). here with    ICD-10-CM   1. Migraine without aura and without status migrainosus, not intractable  G43.009 Rimegepant Sulfate (NURTEC) 75 MG TBDP  ondansetron  (ZOFRAN -ODT) 4 MG disintegrating tablet    2. Nausea  R11.0 Rimegepant Sulfate (NURTEC) 75 MG TBDP    ondansetron  (ZOFRAN -ODT) 4 MG disintegrating tablet     Jaleea reports doing well. Migraines remain well managed on Nurtec and ondansetron  as needed. She will continue  current treatment plan. Healthy lifestyle habits encouraged. She will follow up with me in 1 year.   No orders of the defined types were placed in this encounter.   Meds ordered this encounter  Medications   Rimegepant Sulfate (NURTEC) 75 MG TBDP    Sig: Take 1 tablet (75 mg total) by mouth daily as needed.    Dispense:  8 tablet    Refill:  11    Supervising Provider:   YAN, YIJUN [[id number]]   ondansetron  (ZOFRAN -ODT) 4 MG disintegrating tablet    Sig: Take 1-2 tablets (4-8 mg total) by mouth every 8 (eight) hours as needed.    Dispense:  30 tablet    Refill:  3    Supervising Provider:   YAN, YIJUN [[id number]]     Follow Up Instructions:  I discussed the assessment and treatment plan with the patient. The patient was provided an opportunity to ask questions and all were answered. The patient agreed with the plan and demonstrated an understanding of the instructions.   The patient was advised to call back or seek an in-person evaluation if the symptoms worsen or if the condition fails to improve as anticipated.  I provided 15 minutes of face-to-face and non face-to-face time during this MyChart video encounter. Patient located at their place of residence. Provider is in the office.    Bryan Omura, NP

## 2024-10-17 ENCOUNTER — Encounter: Payer: Self-pay | Admitting: Family Medicine

## 2024-10-17 ENCOUNTER — Telehealth (INDEPENDENT_AMBULATORY_CARE_PROVIDER_SITE_OTHER): Payer: PRIVATE HEALTH INSURANCE | Admitting: Family Medicine

## 2024-10-17 DIAGNOSIS — G43009 Migraine without aura, not intractable, without status migrainosus: Secondary | ICD-10-CM | POA: Diagnosis not present

## 2024-10-17 DIAGNOSIS — R110 Nausea: Secondary | ICD-10-CM | POA: Diagnosis not present

## 2024-10-17 MED ORDER — ONDANSETRON 4 MG PO TBDP: 4.0000 mg | ORAL_TABLET | Freq: Three times a day (TID) | ORAL | 3 refills | Status: AC | PRN

## 2024-10-17 MED ORDER — NURTEC 75 MG PO TBDP: 75.0000 mg | ORAL_TABLET | Freq: Every day | ORAL | 11 refills | Status: AC | PRN

## 2025-10-28 ENCOUNTER — Telehealth: Payer: PRIVATE HEALTH INSURANCE | Admitting: Family Medicine
